# Patient Record
Sex: Female | Born: 1965
Health system: Southern US, Community
[De-identification: ages and names within clinical notes are randomized; demographics above are authoritative.]

## PROBLEM LIST (undated history)

## (undated) DIAGNOSIS — T7840XA Allergy, unspecified, initial encounter: Secondary | ICD-10-CM

## (undated) DIAGNOSIS — E039 Hypothyroidism, unspecified: Secondary | ICD-10-CM

## (undated) HISTORY — DX: Allergy, unspecified, initial encounter: T78.40XA

## (undated) HISTORY — DX: Hypothyroidism, unspecified: E03.9

---

## 1999-10-11 ENCOUNTER — Other Ambulatory Visit: Admission: RE | Admit: 1999-10-11 | Discharge: 1999-10-11 | Payer: Self-pay | Admitting: Gynecology

## 2004-02-03 ENCOUNTER — Ambulatory Visit (HOSPITAL_COMMUNITY): Admission: RE | Admit: 2004-02-03 | Discharge: 2004-02-03 | Payer: Self-pay | Admitting: Internal Medicine

## 2004-09-27 ENCOUNTER — Other Ambulatory Visit: Admission: RE | Admit: 2004-09-27 | Discharge: 2004-09-27 | Payer: Self-pay | Admitting: *Deleted

## 2006-01-02 ENCOUNTER — Other Ambulatory Visit: Admission: RE | Admit: 2006-01-02 | Discharge: 2006-01-02 | Payer: Self-pay | Admitting: *Deleted

## 2006-11-01 ENCOUNTER — Ambulatory Visit: Payer: Self-pay | Admitting: Family Medicine

## 2007-01-02 ENCOUNTER — Ambulatory Visit: Payer: Self-pay | Admitting: Family Medicine

## 2007-01-08 ENCOUNTER — Ambulatory Visit: Payer: Self-pay | Admitting: Family Medicine

## 2007-03-21 ENCOUNTER — Other Ambulatory Visit: Admission: RE | Admit: 2007-03-21 | Discharge: 2007-03-21 | Payer: Self-pay | Admitting: *Deleted

## 2007-12-24 ENCOUNTER — Ambulatory Visit: Payer: Self-pay | Admitting: Family Medicine

## 2009-11-16 LAB — HM MAMMOGRAPHY: HM Mammogram: NEGATIVE

## 2010-06-14 ENCOUNTER — Ambulatory Visit
Admission: RE | Admit: 2010-06-14 | Discharge: 2010-06-14 | Payer: Self-pay | Source: Home / Self Care | Attending: Family Medicine | Admitting: Family Medicine

## 2010-10-13 ENCOUNTER — Encounter: Payer: Self-pay | Admitting: Family Medicine

## 2010-10-13 ENCOUNTER — Ambulatory Visit (INDEPENDENT_AMBULATORY_CARE_PROVIDER_SITE_OTHER): Payer: BC Managed Care – PPO | Admitting: Family Medicine

## 2010-10-13 VITALS — BP 120/70 | HR 62 | Wt 140.0 lb

## 2010-10-13 DIAGNOSIS — R0602 Shortness of breath: Secondary | ICD-10-CM

## 2010-10-13 DIAGNOSIS — J309 Allergic rhinitis, unspecified: Secondary | ICD-10-CM | POA: Insufficient documentation

## 2010-10-13 DIAGNOSIS — J45909 Unspecified asthma, uncomplicated: Secondary | ICD-10-CM

## 2010-10-13 MED ORDER — ALBUTEROL 90 MCG/ACT IN AERS: 2.0000 | INHALATION_SPRAY | Freq: Four times a day (QID) | RESPIRATORY_TRACT | Status: DC | PRN

## 2010-10-13 MED ORDER — FLUTICASONE PROPIONATE 50 MCG/ACT NA SUSP: 2.0000 | Freq: Every day | NASAL | Status: DC

## 2010-10-13 NOTE — Patient Instructions (Signed)
Use the nasal spray daily to help with the congestion and drainage. You can stay on this one for ever. The Proventil should be used 2 puffs 4 times a day as needed however if you need it more than twice a week during the day or twice a month at night then were no immediate more aggressive Attention to any situations where you know your chest tightness and wheezing is worse.

## 2010-10-13 NOTE — Progress Notes (Signed)
  Subjective:    Patient ID: Carly Villa, female    DOB: 1966/02/06, 45 y.o.   MRN: [id number]  HPI she has had difficulty over the last year with postnasal drainage and nasal congestion. She has had no rhinorrhea, sneezing, itchy watery eyes, fever or chills. She has no history of seasonal allergies. In the last 2 weeks she has noted some chest tightness and occasional wheezing. She does smoke 45 cigarettes per day. Otherwise her review of systems is negative.    Review of Systems     Objective:   Physical Exam tympanic membranes normal. Throat is clear. Neck is supple without adenopathy. Cardiac exam shows regular rhythm without murmurs or gallops. Lungs are clear to auscultation. EKG shows no acute changes.       Assessment & Plan:  Allergic rhinitis. Asthma. I discussed all this in detail with her. I will place her on Flonase and Proventil. Discussed proper use these medications. Return here in one month for followup visit.

## 2010-11-17 ENCOUNTER — Encounter: Payer: Self-pay | Admitting: Family Medicine

## 2010-11-21 ENCOUNTER — Ambulatory Visit: Payer: BC Managed Care – PPO | Admitting: Family Medicine

## 2010-12-29 ENCOUNTER — Encounter: Payer: Self-pay | Admitting: Family Medicine

## 2011-06-20 ENCOUNTER — Other Ambulatory Visit: Payer: Self-pay | Admitting: Family Medicine

## 2011-08-21 ENCOUNTER — Other Ambulatory Visit: Payer: Self-pay | Admitting: Family Medicine

## 2011-10-03 ENCOUNTER — Telehealth: Payer: Self-pay | Admitting: Internal Medicine

## 2011-10-03 MED ORDER — SYNTHROID 125 MCG PO TABS: 125.0000 ug | ORAL_TABLET | Freq: Every day | ORAL | Status: DC

## 2011-10-03 NOTE — Telephone Encounter (Signed)
done

## 2011-10-10 ENCOUNTER — Encounter: Payer: BC Managed Care – PPO | Admitting: Family Medicine

## 2011-10-19 ENCOUNTER — Encounter: Payer: Self-pay | Admitting: Internal Medicine

## 2011-10-24 ENCOUNTER — Ambulatory Visit (INDEPENDENT_AMBULATORY_CARE_PROVIDER_SITE_OTHER): Payer: BC Managed Care – PPO | Admitting: Family Medicine

## 2011-10-24 ENCOUNTER — Encounter: Payer: Self-pay | Admitting: Family Medicine

## 2011-10-24 VITALS — BP 122/74 | HR 60 | Ht 64.25 in | Wt 138.0 lb

## 2011-10-24 DIAGNOSIS — E28319 Asymptomatic premature menopause: Secondary | ICD-10-CM

## 2011-10-24 DIAGNOSIS — J45909 Unspecified asthma, uncomplicated: Secondary | ICD-10-CM

## 2011-10-24 DIAGNOSIS — Z79890 Hormone replacement therapy (postmenopausal): Secondary | ICD-10-CM | POA: Insufficient documentation

## 2011-10-24 DIAGNOSIS — E039 Hypothyroidism, unspecified: Secondary | ICD-10-CM

## 2011-10-24 DIAGNOSIS — F17200 Nicotine dependence, unspecified, uncomplicated: Secondary | ICD-10-CM

## 2011-10-24 DIAGNOSIS — J309 Allergic rhinitis, unspecified: Secondary | ICD-10-CM | POA: Insufficient documentation

## 2011-10-24 DIAGNOSIS — Z0000 Encounter for general adult medical examination without abnormal findings: Secondary | ICD-10-CM

## 2011-10-24 LAB — POCT URINALYSIS DIPSTICK
Bilirubin, UA: NEGATIVE
Glucose, UA: NEGATIVE
Urobilinogen, UA: NEGATIVE

## 2011-10-24 LAB — TSH: TSH: 6.742 u[IU]/mL — ABNORMAL HIGH (ref 0.350–4.500)

## 2011-10-24 NOTE — Progress Notes (Signed)
  Subjective:    Patient ID: Carly Villa, female    DOB: Sep 05, 1965, 46 y.o.   MRN: [id number]  HPI She is here for a complete examination. She does have underlying thyroid disease and continues on this medication without trouble. She also is on hormone replacement for treatment of premature menopause. She was placed on this by her gynecologist. She continues on OTC medications and is having no difficulty with them. She doesn't smoke usually 3 cigarettes per day in the evening when she gets home and has a glass of wine with it. Her allergies give her very little difficulty. She does have asthma but has only use the inhaler twice this year. Social and family history were reviewed.   Review of Systems  Constitutional: Negative.   HENT: Negative.   Eyes: Negative.   Respiratory: Negative.   Cardiovascular: Negative.   Gastrointestinal: Negative.   Genitourinary: Negative.   Musculoskeletal: Negative.   Neurological: Negative.        Objective:   Physical Exam BP 122/74  Pulse 60  Ht 5' 4.25" (1.632 m)  Wt 138 lb (62.596 kg)  BMI 23.50 kg/m2  General Appearance:    Alert, cooperative, no distress, appears stated age  Head:    Normocephalic, without obvious abnormality, atraumatic  Eyes:    PERRL, conjunctiva/corneas clear, EOM's intact, fundi    benign  Ears:    Normal TM's and external ear canals  Nose:   Nares normal, mucosa normal, no drainage or sinus   tenderness  Throat:   Lips, mucosa, and tongue normal; teeth and gums normal  Neck:   Supple, no lymphadenopathy;  thyroid:  no   enlargement/tenderness/nodules; no carotid   bruit or JVD  Back:    Spine nontender, no curvature, ROM normal, no CVA     tenderness  Lungs:     Clear to auscultation bilaterally without wheezes, rales or     ronchi; respirations unlabored  Chest Wall:    No tenderness or deformity   Heart:    Regular rate and rhythm, S1 and S2 normal, no murmur, rub   or gallop  Breast Exam:    Deferred to GYN   Abdomen:     Soft, non-tender, nondistended, normoactive bowel sounds,    no masses, no hepatosplenomegaly  Genitalia:    Deferred to GYN     Extremities:   No clubbing, cyanosis or edema  Pulses:   2+ and symmetric all extremities  Skin:   Skin color, texture, turgor normal, no rashes or lesions  Lymph nodes:   Cervical, supraclavicular, and axillary nodes normal  Neurologic:   CNII-XII intact, normal strength, sensation and gait; reflexes 2+ and symmetric throughout          Psych:   Normal mood, affect, hygiene and grooming.          Assessment & Plan:   1. Physical exam, annual  POCT Urinalysis Dipstick, Lipid panel  2. Allergic rhinitis, mild    3. Asthma    4. Premature menopause on HRT    5. Hypothyroid  TSH  6. Current smoker on some days     discuss smoking cessation with her. Strongly encouraged her to change her smoking habits. Discussed alcohol consumption with this. Explained that her problem is mainly trigger related. Discussed various options is that of smoking and drinking alcohol. She will call if further difficulty. Otherwise continue on present medication regimen.

## 2011-10-25 LAB — LIPID PANEL
Total CHOL/HDL Ratio: 2.9 Ratio
VLDL: 16 mg/dL (ref 0–40)

## 2011-11-05 ENCOUNTER — Other Ambulatory Visit: Payer: Self-pay | Admitting: Family Medicine

## 2011-11-20 ENCOUNTER — Encounter: Payer: Self-pay | Admitting: Family Medicine

## 2012-03-05 ENCOUNTER — Ambulatory Visit: Payer: Self-pay | Admitting: Internal Medicine

## 2012-06-09 ENCOUNTER — Other Ambulatory Visit: Payer: Self-pay | Admitting: Family Medicine

## 2012-06-27 ENCOUNTER — Encounter: Payer: Self-pay | Admitting: Family Medicine

## 2012-06-27 ENCOUNTER — Ambulatory Visit (INDEPENDENT_AMBULATORY_CARE_PROVIDER_SITE_OTHER): Payer: BC Managed Care – PPO | Admitting: Family Medicine

## 2012-06-27 VITALS — BP 124/82 | HR 68 | Wt 141.0 lb

## 2012-06-27 DIAGNOSIS — M549 Dorsalgia, unspecified: Secondary | ICD-10-CM

## 2012-06-27 DIAGNOSIS — K219 Gastro-esophageal reflux disease without esophagitis: Secondary | ICD-10-CM

## 2012-06-27 NOTE — Progress Notes (Signed)
  Subjective:    Patient ID: Carly Villa, female    DOB: 19-Jun-1965, 47 y.o.   MRN: [id number]  HPI She has a five-month history of acid feeling occurring after meals. Can occur with any food. She has been taking Zantac which did initially work but is not working now. Sometimes she will wake up at night. No particular food makes this worse. Size a two-week history of upper back stiffness that occurs usually in the morning but would then slowly go away. She now notes that the pain is moved down and she notes it more in her low back. Again the pain does tend to get better as the day goes on. She has tried 2 Advil She does have concerns over esophageal cancer as well as lung cancer. She does smoke. Review of Systems     Objective:   Physical Exam Alert and in no distress. Cardiac exam shows regular rhythm without murmurs gallops. Lungs are clear to auscultation. Abdominal exam shows normal bowel sounds without masses or tenderness. Back exam shows full motion. No tenderness to palpation. Lungs are clear to auscultation.       Assessment & Plan:   1. GERD (gastroesophageal reflux disease)   2. Back pain    reassured her that none of her symptoms point towards cancer. Recommend she use Prilosec for her reflux symptoms and double dose if no improvement. She is then to call me for switch to a different PPI. Also recommended conservative care for back including anti-inflammatory heat and stretching. She will return here if continued difficulty.

## 2012-06-27 NOTE — Patient Instructions (Signed)
Use Prilosec 1 pill per day for the reflux symptoms and if that doesn't work go to 2 pills per day. If that doesn't work call me and I will call in a different medication. Once you have it under control after a week or 2 go to every other day and if it stays under control then you can even go to every 3 days.For the back use 4 Advil 3 times per day, heat for 20 minutes 3 times per day and then stretch. Proper lifting sitting and and sleeping

## 2012-07-23 ENCOUNTER — Other Ambulatory Visit: Payer: Self-pay | Admitting: Family Medicine

## 2012-09-13 ENCOUNTER — Ambulatory Visit (INDEPENDENT_AMBULATORY_CARE_PROVIDER_SITE_OTHER): Payer: BC Managed Care – PPO | Admitting: Family Medicine

## 2012-09-13 ENCOUNTER — Encounter: Payer: Self-pay | Admitting: Family Medicine

## 2012-09-13 VITALS — BP 134/82 | HR 68 | Wt 139.0 lb

## 2012-09-13 DIAGNOSIS — L723 Sebaceous cyst: Secondary | ICD-10-CM

## 2012-09-13 DIAGNOSIS — L089 Local infection of the skin and subcutaneous tissue, unspecified: Secondary | ICD-10-CM

## 2012-09-13 MED ORDER — SYNTHROID 125 MCG PO TABS: 125.0000 ug | ORAL_TABLET | Freq: Every day | ORAL | Status: DC

## 2012-09-13 MED ORDER — DOXYCYCLINE HYCLATE 100 MG PO TABS: 100.0000 mg | ORAL_TABLET | Freq: Two times a day (BID) | ORAL | Status: DC

## 2012-09-13 NOTE — Patient Instructions (Addendum)
Change the dressing as needed depending upon how much it drains. Take the packing out on Sunday and you can irrigate it. Call me if you have a questions.

## 2012-09-13 NOTE — Progress Notes (Signed)
  Subjective:    Patient ID: Carly Villa, female    DOB: May 25, 1965, 47 y.o.   MRN: [id number]  HPI She is here for evaluation of swollen tender red lesionunderneath the left breast.   Review of Systems     Objective:   Physical Exam A 3 cm slightly raised tender fluctuant lesion is noted with central dimpling       Assessment & Plan:  Infected sebaceous cyst - Plan: DISCONTINUED: doxycycline (VIBRA-TABS) 100 MG tablet doxycycline not prescribed.  the area was injected with Xylocaine and epinephrine. A 2 cm incision was made. Purulent material was expressed as well as parts of the cyst sac. The wound wascleaned and the cyst sac removed. Was packed with iodoform. She is to remove the packing in 2 days and irrigate the area.

## 2012-10-28 ENCOUNTER — Telehealth: Payer: Self-pay | Admitting: Family Medicine

## 2012-10-28 MED ORDER — SYNTHROID 125 MCG PO TABS: 125.0000 ug | ORAL_TABLET | Freq: Every day | ORAL | Status: DC

## 2012-10-28 NOTE — Telephone Encounter (Signed)
Sent in med with 1 refill

## 2012-10-28 NOTE — Telephone Encounter (Signed)
Pt scheduled a cpe in July but will be out of meds before then. Pt needs synthroid. Pt uses [facility].

## 2012-12-06 ENCOUNTER — Encounter: Payer: Self-pay | Admitting: Family Medicine

## 2012-12-06 ENCOUNTER — Ambulatory Visit (INDEPENDENT_AMBULATORY_CARE_PROVIDER_SITE_OTHER): Payer: BC Managed Care – PPO | Admitting: Family Medicine

## 2012-12-06 VITALS — BP 114/70 | HR 74 | Ht 65.0 in | Wt 137.0 lb

## 2012-12-06 DIAGNOSIS — M899 Disorder of bone, unspecified: Secondary | ICD-10-CM

## 2012-12-06 DIAGNOSIS — E039 Hypothyroidism, unspecified: Secondary | ICD-10-CM

## 2012-12-06 DIAGNOSIS — Z0000 Encounter for general adult medical examination without abnormal findings: Secondary | ICD-10-CM

## 2012-12-06 DIAGNOSIS — E28319 Asymptomatic premature menopause: Secondary | ICD-10-CM

## 2012-12-06 DIAGNOSIS — J309 Allergic rhinitis, unspecified: Secondary | ICD-10-CM

## 2012-12-06 DIAGNOSIS — M8580 Other specified disorders of bone density and structure, unspecified site: Secondary | ICD-10-CM

## 2012-12-06 LAB — POCT URINALYSIS DIPSTICK
Bilirubin, UA: NEGATIVE
Blood, UA: NEGATIVE
Ketones, UA: NEGATIVE
Leukocytes, UA: NEGATIVE

## 2012-12-06 LAB — CBC WITH DIFFERENTIAL/PLATELET
Basophils Absolute: 0 10*3/uL (ref 0.0–0.1)
Eosinophils Absolute: 0.2 10*3/uL (ref 0.0–0.7)
HCT: 38.9 % (ref 36.0–46.0)
Hemoglobin: 13.1 g/dL (ref 12.0–15.0)
Lymphocytes Relative: 34 % (ref 12–46)
MCHC: 33.7 g/dL (ref 30.0–36.0)
Monocytes Relative: 9 % (ref 3–12)
Neutrophils Relative %: 54 % (ref 43–77)
Platelets: 335 10*3/uL (ref 150–400)
RBC: 4.1 MIL/uL (ref 3.87–5.11)

## 2012-12-06 LAB — HM DEXA SCAN

## 2012-12-06 MED ORDER — SYNTHROID 125 MCG PO TABS: 125.0000 ug | ORAL_TABLET | Freq: Every day | ORAL | Status: DC

## 2012-12-06 NOTE — Progress Notes (Signed)
  Subjective:    Patient ID: Carly Villa, female    DOB: 08/31/1965, 47 y.o.   MRN: [id number]  HPI She is here for complete examination. She does see her gynecologist regularly. She apparently does have premature menopause. A DEXA scan was done which did show evidence of osteopenia and she now is on vitamin D and calcium as well as a regular multivitamin. Her allergies are under good control. She continues on Synthroid for treatment of hypothyroidism. She does smoke 2 cigarettes per day has part of her relaxation after work. Social and family history were reviewed.   Review of Systems  Constitutional: Negative.   HENT: Negative.   Eyes: Negative.   Respiratory: Negative.   Cardiovascular: Negative.   Gastrointestinal: Negative.   Endocrine: Negative.   Allergic/Immunologic: Negative.   Neurological: Negative.        Objective:   Physical Exam BP 114/70  Pulse 74  Ht 5\' 5"  (1.651 m)  Wt 137 lb (62.143 kg)  BMI 22.8 kg/m2  General Appearance:    Alert, cooperative, no distress, appears stated age  Head:    Normocephalic, without obvious abnormality, atraumatic  Eyes:    PERRL, conjunctiva/corneas clear, EOM's intact, fundi    benign  Ears:    Normal TM's and external ear canals  Nose:   Nares normal, mucosa normal, no drainage or sinus   tenderness  Throat:   Lips, mucosa, and tongue normal; teeth and gums normal  Neck:   Supple, no lymphadenopathy;  thyroid:  no   enlargement/tenderness/nodules; no carotid   bruit or JVD  Back:    Spine nontender, no curvature, ROM normal, no CVA     tenderness  Lungs:     Clear to auscultation bilaterally without wheezes, rales or     ronchi; respirations unlabored  Chest Wall:    No tenderness or deformity   Heart:    Regular rate and rhythm, S1 and S2 normal, no murmur, rub   or gallop  Breast Exam:    Deferred to GYN  Abdomen:     Soft, non-tender, nondistended, normoactive bowel sounds,    no masses, no hepatosplenomegaly   Genitalia:    Deferred to GYN     Extremities:   No clubbing, cyanosis or edema  Pulses:   2+ and symmetric all extremities  Skin:   Skin color, texture, turgor normal, no rashes or lesions  Lymph nodes:   Cervical, supraclavicular, and axillary nodes normal  Neurologic:   CNII-XII intact, normal strength, sensation and gait; reflexes 2+ and symmetric throughout          Psych:   Normal mood, affect, hygiene and grooming.          Assessment & Plan:  Routine general medical examination at a health care facility - Plan: POCT Urinalysis Dipstick, CBC with Differential, Comprehensive metabolic panel, Lipid panel  Allergic rhinitis, mild  Hypothyroid - Plan: TSH, SYNTHROID 125 MCG tablet  Premature menopause on HRT  Osteopenia - Plan: CBC with Differential, Comprehensive metabolic panel  I discussed the fact that she smokes 2 cigarettes per day and explained that although not dangerous, is not smart either. Strongly encouraged her to use some other method for relaxation

## 2012-12-07 LAB — LIPID PANEL
Cholesterol: 187 mg/dL (ref 0–200)
LDL Cholesterol: 99 mg/dL (ref 0–99)
Triglycerides: 90 mg/dL (ref <150)
VLDL: 18 mg/dL (ref 0–40)

## 2012-12-07 LAB — COMPREHENSIVE METABOLIC PANEL
AST: 16 U/L (ref 0–37)
Albumin: 4.5 g/dL (ref 3.5–5.2)
Alkaline Phosphatase: 54 U/L (ref 39–117)
Chloride: 104 mEq/L (ref 96–112)
Creat: 0.58 mg/dL (ref 0.50–1.10)
Glucose, Bld: 93 mg/dL (ref 70–99)

## 2012-12-09 NOTE — Progress Notes (Signed)
Quick Note:  Mailed pt letter of lab results ______ 

## 2013-07-20 LAB — HM PAP SMEAR: HM PAP: NORMAL

## 2013-07-26 LAB — HM MAMMOGRAPHY: HM MAMMO: NORMAL

## 2013-08-11 ENCOUNTER — Ambulatory Visit: Payer: Self-pay | Admitting: Physician Assistant

## 2013-08-11 ENCOUNTER — Ambulatory Visit: Payer: Self-pay

## 2013-08-11 LAB — CBC WITH DIFFERENTIAL/PLATELET
BASOS ABS: 0 10*3/uL (ref 0.0–0.1)
Basophil %: 0.6 %
EOS ABS: 0.1 10*3/uL (ref 0.0–0.7)
Eosinophil %: 1.7 %
HCT: 36.4 % (ref 35.0–47.0)
HGB: 12.2 g/dL (ref 12.0–16.0)
LYMPHS ABS: 2.8 10*3/uL (ref 1.0–3.6)
LYMPHS PCT: 33.6 %
MCH: 32.3 pg (ref 26.0–34.0)
MCHC: 33.5 g/dL (ref 32.0–36.0)
MCV: 96 fL (ref 80–100)
MONO ABS: 0.7 x10 3/mm (ref 0.2–0.9)
MONOS PCT: 8.1 %
Neutrophil #: 4.6 10*3/uL (ref 1.4–6.5)
Neutrophil %: 56 %
PLATELETS: 325 10*3/uL (ref 150–440)
RBC: 3.78 10*6/uL — ABNORMAL LOW (ref 3.80–5.20)
RDW: 12.4 % (ref 11.5–14.5)
WBC: 8.2 10*3/uL (ref 3.6–11.0)

## 2013-08-11 LAB — TSH: THYROID STIMULATING HORM: 1.4 u[IU]/mL

## 2013-09-06 ENCOUNTER — Other Ambulatory Visit: Payer: Self-pay | Admitting: Family Medicine

## 2013-10-06 ENCOUNTER — Other Ambulatory Visit: Payer: Self-pay | Admitting: Family Medicine

## 2013-11-17 ENCOUNTER — Other Ambulatory Visit: Payer: Self-pay | Admitting: Family Medicine

## 2013-12-17 ENCOUNTER — Ambulatory Visit (INDEPENDENT_AMBULATORY_CARE_PROVIDER_SITE_OTHER): Payer: BC Managed Care – PPO | Admitting: Family Medicine

## 2013-12-17 ENCOUNTER — Encounter: Payer: Self-pay | Admitting: Family Medicine

## 2013-12-17 VITALS — BP 128/80 | HR 68 | Ht 65.0 in | Wt 140.0 lb

## 2013-12-17 DIAGNOSIS — E039 Hypothyroidism, unspecified: Secondary | ICD-10-CM

## 2013-12-17 DIAGNOSIS — J309 Allergic rhinitis, unspecified: Secondary | ICD-10-CM

## 2013-12-17 DIAGNOSIS — Z0000 Encounter for general adult medical examination without abnormal findings: Secondary | ICD-10-CM

## 2013-12-17 DIAGNOSIS — E28319 Asymptomatic premature menopause: Secondary | ICD-10-CM

## 2013-12-17 DIAGNOSIS — Z79890 Hormone replacement therapy (postmenopausal): Secondary | ICD-10-CM

## 2013-12-17 LAB — TSH: TSH: 0.959 u[IU]/mL (ref 0.350–4.500)

## 2013-12-17 NOTE — Progress Notes (Signed)
   Subjective:    Patient ID: Carly Villa, female    DOB: Oct 16, 1965, 48 y.o.   MRN: [id number]  HPI He is here for a complete examination. She sees her gynecologist regularly and continues on hormone replacement due to premature menopause. She does have underlying hypothyroid and continues on her I replacement. She does have underlying allergies and these cause very little difficulty. She has no other concerns or complaints. Her family and social history were reviewed. Her marriage and work are going quite well.   Review of Systems  All other systems reviewed and are negative.      Objective:   Physical Exam alert and in no distress. Tympanic membranes and canals are normal. Throat is clear. Tonsils are normal. Neck is supple without adenopathy or thyromegaly. Cardiac exam shows a regular sinus rhythm without murmurs or gallops. Lungs are clear to auscultation. Abdominal exam shows no masses or tenderness with normal bowel sounds        Assessment & Plan:  Routine general medical examination at a health care facility  Premature menopause on HRT  Hypothyroidism, unspecified hypothyroidism type - Plan: TSH  Allergic rhinitis, mild  I encouraged her to continue to take good care of herself.

## 2013-12-18 MED ORDER — SYNTHROID 125 MCG PO TABS: ORAL_TABLET | ORAL | Status: DC

## 2013-12-18 NOTE — Addendum Note (Signed)
Addended by: Denita Lung on: 12/18/2013 08:11 AM   Modules accepted: Orders

## 2014-01-22 ENCOUNTER — Ambulatory Visit: Payer: Self-pay | Admitting: Physician Assistant

## 2014-02-24 ENCOUNTER — Ambulatory Visit (INDEPENDENT_AMBULATORY_CARE_PROVIDER_SITE_OTHER): Payer: BC Managed Care – PPO | Admitting: Family Medicine

## 2014-02-24 ENCOUNTER — Encounter: Payer: Self-pay | Admitting: Family Medicine

## 2014-02-24 VITALS — BP 120/78 | HR 82 | Temp 98.2°F | Wt 140.0 lb

## 2014-02-24 DIAGNOSIS — J069 Acute upper respiratory infection, unspecified: Secondary | ICD-10-CM

## 2014-02-24 MED ORDER — AZITHROMYCIN 500 MG PO TABS: 500.0000 mg | ORAL_TABLET | Freq: Every day | ORAL | Status: DC

## 2014-02-24 NOTE — Patient Instructions (Signed)
Use Afrin nasal spray for your nasal congestion and your flight

## 2014-02-24 NOTE — Progress Notes (Signed)
   Subjective:    Patient ID: Carly Villa, female    DOB: 04/28/1966, 48 y.o.   MRN: [id number]  HPI 5 days ago she developed a sore throat, nasal congestion, hoarse voice and  chest congestion some wheezing,fatigue with myalgias. No earache, nausea or vomiting. She leaves for [place] on Friday. She does smoke 2 or 3 cigarettes per day.  Review of Systems     Objective:   Physical Exam alert and in no distress. Tympanic membranes and canals are normal. Throat is clear. Tonsils are normal. Neck is supple without adenopathy or thyromegaly. Cardiac exam shows a regular sinus rhythm without murmurs or gallops. Lungs are clear to auscultation.        Assessment & Plan:  URI, acute - Plan: azithromycin (ZITHROMAX) 500 MG tablet  I explained that her symptoms could certainly be all viral but I will give her the benefit of the doubt. Also recommend using Afrin nasal spray for the flight.

## 2014-10-02 LAB — HM MAMMOGRAPHY: HM MAMMO: NORMAL

## 2014-10-02 LAB — HM PAP SMEAR: HM PAP: NORMAL

## 2014-10-02 LAB — HM DEXA SCAN

## 2014-12-19 ENCOUNTER — Other Ambulatory Visit: Payer: Self-pay | Admitting: Family Medicine

## 2014-12-21 NOTE — Telephone Encounter (Signed)
Left message pt needs appointment before I can fill med

## 2014-12-21 NOTE — Telephone Encounter (Signed)
Pt called and scheduled cpe please fill medication

## 2015-01-08 ENCOUNTER — Encounter: Payer: Self-pay | Admitting: Family Medicine

## 2015-02-02 ENCOUNTER — Encounter: Payer: Self-pay | Admitting: Family Medicine

## 2015-02-02 ENCOUNTER — Ambulatory Visit (INDEPENDENT_AMBULATORY_CARE_PROVIDER_SITE_OTHER): Payer: BLUE CROSS/BLUE SHIELD | Admitting: Family Medicine

## 2015-02-02 VITALS — BP 120/70 | HR 68 | Ht 65.0 in | Wt 144.0 lb

## 2015-02-02 DIAGNOSIS — Z0000 Encounter for general adult medical examination without abnormal findings: Secondary | ICD-10-CM

## 2015-02-02 DIAGNOSIS — Z720 Tobacco use: Secondary | ICD-10-CM

## 2015-02-02 DIAGNOSIS — E038 Other specified hypothyroidism: Secondary | ICD-10-CM

## 2015-02-02 DIAGNOSIS — J309 Allergic rhinitis, unspecified: Secondary | ICD-10-CM

## 2015-02-02 DIAGNOSIS — E28319 Asymptomatic premature menopause: Secondary | ICD-10-CM

## 2015-02-02 DIAGNOSIS — Z87891 Personal history of nicotine dependence: Secondary | ICD-10-CM | POA: Insufficient documentation

## 2015-02-02 DIAGNOSIS — F17200 Nicotine dependence, unspecified, uncomplicated: Secondary | ICD-10-CM

## 2015-02-02 DIAGNOSIS — Z79890 Hormone replacement therapy (postmenopausal): Secondary | ICD-10-CM

## 2015-02-02 LAB — LIPID PANEL
CHOL/HDL RATIO: 2.9 ratio (ref <=5.0)
Cholesterol: 181 mg/dL (ref 125–200)
HDL: 62 mg/dL (ref >=46)
LDL CALC: 96 mg/dL (ref <130)
TRIGLYCERIDES: 116 mg/dL (ref <150)
VLDL: 23 mg/dL (ref <30)

## 2015-02-02 LAB — COMPREHENSIVE METABOLIC PANEL
ALT: 23 U/L (ref 6–29)
AST: 19 U/L (ref 10–35)
Albumin: 4.5 g/dL (ref 3.6–5.1)
Alkaline Phosphatase: 58 U/L (ref 33–115)
BUN: 11 mg/dL (ref 7–25)
CALCIUM: 9.8 mg/dL (ref 8.6–10.2)
CHLORIDE: 100 mmol/L (ref 98–110)
CO2: 23 mmol/L (ref 20–31)
Creat: 0.55 mg/dL (ref 0.50–1.10)
GLUCOSE: 87 mg/dL (ref 65–99)
POTASSIUM: 4.3 mmol/L (ref 3.5–5.3)
Sodium: 138 mmol/L (ref 135–146)
Total Bilirubin: 0.5 mg/dL (ref 0.2–1.2)
Total Protein: 6.8 g/dL (ref 6.1–8.1)

## 2015-02-02 LAB — CBC WITH DIFFERENTIAL/PLATELET
Basophils Absolute: 0.1 10*3/uL (ref 0.0–0.1)
Basophils Relative: 1 % (ref 0–1)
EOS PCT: 2 % (ref 0–5)
Eosinophils Absolute: 0.2 10*3/uL (ref 0.0–0.7)
HEMATOCRIT: 37.6 % (ref 36.0–46.0)
Hemoglobin: 12.9 g/dL (ref 12.0–15.0)
LYMPHS PCT: 36 % (ref 12–46)
Lymphs Abs: 2.9 10*3/uL (ref 0.7–4.0)
MCH: 32.7 pg (ref 26.0–34.0)
MCHC: 34.3 g/dL (ref 30.0–36.0)
MCV: 95.4 fL (ref 78.0–100.0)
MONO ABS: 0.8 10*3/uL (ref 0.1–1.0)
MONOS PCT: 10 % (ref 3–12)
MPV: 9.4 fL (ref 8.6–12.4)
Neutro Abs: 4.1 10*3/uL (ref 1.7–7.7)
Neutrophils Relative %: 51 % (ref 43–77)
Platelets: 330 10*3/uL (ref 150–400)
RBC: 3.94 MIL/uL (ref 3.87–5.11)
RDW: 13.6 % (ref 11.5–15.5)
WBC: 8.1 10*3/uL (ref 4.0–10.5)

## 2015-02-02 NOTE — Progress Notes (Signed)
Subjective:    Patient ID: Carly Villa, female    DOB: 1966-04-08, 49 y.o.   MRN: [id number]  HPI She is here for complete examination. She does have premature menopause and is on HRT. She sees her gynecologist regularly for this.She also has hypothyroid and continues on her Synthroid. Her allergies are under good control. She has had a several month history of a tingling sensation in the mid scapular area with slight [place] type sensation to left chest. It is worse with motion. No shortness of breath, fever, chills, true chest pain. In September she did have a toe fracture and now states that she has a slight numb feeling over the dorsum of the foot. She has started smoking again and blames this on the death of her mother. She does smoke less than a half a pack per day. Her work and home life are going well. Family and social history was otherwise reviewed. Immunizations and health maintenance was also reviewed. She did have a DEXA scan done this year.   Review of Systems  All other systems reviewed and are negative.      Objective:   Physical Exam BP 120/70 mmHg  Pulse 68  Ht 5\' 5"  (1.651 m)  Wt 144 lb (65.318 kg)  BMI 23.96 kg/m2  SpO2 99%  General Appearance:    Alert, cooperative, no distress, appears stated age  Head:    Normocephalic, without obvious abnormality, atraumatic  Eyes:    PERRL, conjunctiva/corneas clear, EOM's intact, fundi    benign  Ears:    Normal TM's and external ear canals  Nose:   Nares normal, mucosa normal, no drainage or sinus   tenderness  Throat:   Lips, mucosa, and tongue normal; teeth and gums normal  Neck:   Supple, no lymphadenopathy;  thyroid:  no   enlargement/tenderness/nodules; no carotid   bruit or JVD  Back:    Spine nontender, no curvature, ROM normal, no CVA     tenderness  Lungs:     Clear to auscultation bilaterally without wheezes, rales or     ronchi; respirations unlabored  Chest Wall:    No tenderness or deformity   Heart:     Regular rate and rhythm, S1 and S2 normal, no murmur, rub   or gallop  Breast Exam:    Deferred to GYN  Abdomen:     Soft, non-tender, nondistended, normoactive bowel sounds,    no masses, no hepatosplenomegaly  Genitalia:    Deferred to GYN     Extremities:   No clubbing, cyanosis or edema  Pulses:   2+ and symmetric all extremities  Skin:   Skin color, texture, turgor normal, no rashes or lesions  Lymph nodes:   Cervical, supraclavicular, and axillary nodes normal  Neurologic:   CNII-XII intact, normal strength, sensation and gait; reflexes 2+ and symmetric throughout          Psych:   Normal mood, affect, hygiene and grooming.          Assessment & Plan:  Routine general medical examination at a health care facility - Plan: CBC with Differential/Platelet, Comprehensive metabolic panel, Lipid panel  Allergic rhinitis, mild  Premature menopause on HRT  Other specified hypothyroidism  Current smoker I discussed smoking cessation with her. I explained that the amount that she is smoking now is not truly an addiction but more in relation to stress reduction. Discussed ways to mitigate this by taking deep breaths and taking time away when  she has a same urge to smoke. She will make further attempts to use this technique. If she has problems she is to return. She will otherwise continue on her present medications. Also explained that the tingling sensation in her upper back and decreased sensationOver the dorsum of the foot were of no major concern. She was comfortable with that.

## 2015-04-04 ENCOUNTER — Other Ambulatory Visit: Payer: Self-pay | Admitting: Family Medicine

## 2015-04-21 ENCOUNTER — Encounter: Payer: Self-pay | Admitting: Family Medicine

## 2015-04-21 ENCOUNTER — Ambulatory Visit (INDEPENDENT_AMBULATORY_CARE_PROVIDER_SITE_OTHER): Payer: BLUE CROSS/BLUE SHIELD | Admitting: Family Medicine

## 2015-04-21 VITALS — BP 130/80 | HR 65 | Ht 65.0 in | Wt 145.0 lb

## 2015-04-21 DIAGNOSIS — L659 Nonscarring hair loss, unspecified: Secondary | ICD-10-CM | POA: Diagnosis not present

## 2015-04-21 DIAGNOSIS — E039 Hypothyroidism, unspecified: Secondary | ICD-10-CM | POA: Diagnosis not present

## 2015-04-21 LAB — TSH: TSH: 2.569 u[IU]/mL (ref 0.350–4.500)

## 2015-04-21 NOTE — Progress Notes (Signed)
   Subjective:    Patient ID: Carly Villa, female    DOB: [birth year], 49 y.o.   MRN: [id number]  HPI She has noted over the last couple weeks difficulty with hair loss. She's had no new medications, she continues on her thyroid medication and has had no trouble with that. She has not changed brands. She has not been sick recently. No skin changes   Review of Systems     Objective:   Physical Exam Alert and in no distress. Skin is normal. Exam of her scalp shows no areas of alopecia       Assessment & Plan:  Hypothyroidism, unspecified hypothyroidism type - Plan: TSH  Hair loss - Plan: TSH  I will check a TSH on her but explained that this is probably related to an imbalance of hair loss/hair gain and should return to normal.

## 2015-12-01 LAB — HM PAP SMEAR: HM Pap smear: NORMAL

## 2015-12-01 LAB — HM MAMMOGRAPHY

## 2016-03-02 ENCOUNTER — Ambulatory Visit (INDEPENDENT_AMBULATORY_CARE_PROVIDER_SITE_OTHER): Payer: 59 | Admitting: Family Medicine

## 2016-03-02 ENCOUNTER — Encounter: Payer: Self-pay | Admitting: Family Medicine

## 2016-03-02 VITALS — BP 120/70 | HR 64 | Ht 65.0 in | Wt 143.0 lb

## 2016-03-02 DIAGNOSIS — F17200 Nicotine dependence, unspecified, uncomplicated: Secondary | ICD-10-CM

## 2016-03-02 DIAGNOSIS — Z79890 Hormone replacement therapy (postmenopausal): Secondary | ICD-10-CM | POA: Diagnosis not present

## 2016-03-02 DIAGNOSIS — Z23 Encounter for immunization: Secondary | ICD-10-CM

## 2016-03-02 DIAGNOSIS — L659 Nonscarring hair loss, unspecified: Secondary | ICD-10-CM | POA: Diagnosis not present

## 2016-03-02 DIAGNOSIS — Z0000 Encounter for general adult medical examination without abnormal findings: Secondary | ICD-10-CM

## 2016-03-02 DIAGNOSIS — J309 Allergic rhinitis, unspecified: Secondary | ICD-10-CM

## 2016-03-02 DIAGNOSIS — D171 Benign lipomatous neoplasm of skin and subcutaneous tissue of trunk: Secondary | ICD-10-CM

## 2016-03-02 DIAGNOSIS — E039 Hypothyroidism, unspecified: Secondary | ICD-10-CM

## 2016-03-02 DIAGNOSIS — E28319 Asymptomatic premature menopause: Secondary | ICD-10-CM

## 2016-03-02 LAB — LIPID PANEL
CHOL/HDL RATIO: 2.6 ratio (ref <=5.0)
Cholesterol: 194 mg/dL (ref 125–200)
HDL: 74 mg/dL (ref >=46)
LDL CALC: 82 mg/dL (ref <130)
TRIGLYCERIDES: 192 mg/dL — AB (ref <150)
VLDL: 38 mg/dL — AB (ref <30)

## 2016-03-02 LAB — CBC WITH DIFFERENTIAL/PLATELET
BASOS ABS: 0 {cells}/uL (ref 0–200)
Basophils Relative: 0 %
EOS PCT: 2 %
Eosinophils Absolute: 178 cells/uL (ref 15–500)
HCT: 37.8 % (ref 35.0–45.0)
Hemoglobin: 12.4 g/dL (ref 11.7–15.5)
Lymphocytes Relative: 26 %
Lymphs Abs: 2314 cells/uL (ref 850–3900)
MCH: 28.1 pg (ref 27.0–33.0)
MCHC: 32.8 g/dL (ref 32.0–36.0)
MCV: 85.7 fL (ref 80.0–100.0)
MONOS PCT: 10 %
MPV: 9.2 fL (ref 7.5–12.5)
Monocytes Absolute: 890 cells/uL (ref 200–950)
NEUTROS PCT: 62 %
Neutro Abs: 5518 cells/uL (ref 1500–7800)
PLATELETS: 360 10*3/uL (ref 140–400)
RBC: 4.41 MIL/uL (ref 3.80–5.10)
RDW: 17.5 % — ABNORMAL HIGH (ref 11.0–15.0)
WBC: 8.9 10*3/uL (ref 4.0–10.5)

## 2016-03-02 LAB — COMPREHENSIVE METABOLIC PANEL
ALBUMIN: 4.5 g/dL (ref 3.6–5.1)
ALT: 25 U/L (ref 6–29)
AST: 20 U/L (ref 10–35)
Alkaline Phosphatase: 59 U/L (ref 33–115)
BUN: 11 mg/dL (ref 7–25)
CHLORIDE: 102 mmol/L (ref 98–110)
CO2: 22 mmol/L (ref 20–31)
CREATININE: 0.54 mg/dL (ref 0.50–1.10)
Calcium: 9.8 mg/dL (ref 8.6–10.2)
Glucose, Bld: 89 mg/dL (ref 65–99)
POTASSIUM: 4.5 mmol/L (ref 3.5–5.3)
SODIUM: 136 mmol/L (ref 135–146)
Total Bilirubin: 0.5 mg/dL (ref 0.2–1.2)
Total Protein: 7.1 g/dL (ref 6.1–8.1)

## 2016-03-02 LAB — POCT URINALYSIS DIPSTICK
BILIRUBIN UA: NEGATIVE
Blood, UA: NEGATIVE
Glucose, UA: NEGATIVE
KETONES UA: NEGATIVE
LEUKOCYTES UA: NEGATIVE
Nitrite, UA: NEGATIVE
PH UA: 6
Protein, UA: NEGATIVE
SPEC GRAV UA: 1.025
Urobilinogen, UA: NEGATIVE

## 2016-03-02 LAB — TSH: TSH: 1.22 mIU/L

## 2016-03-02 MED ORDER — SYNTHROID 125 MCG PO TABS: 125.0000 ug | ORAL_TABLET | Freq: Every day | ORAL | 3 refills | Status: DC

## 2016-03-02 NOTE — Progress Notes (Signed)
Subjective:    Patient ID: Carly Villa, female    DOB: 1965/06/12, 50 y.o.   MRN: [id number]  HPI Is here for complete examination. She did have difficulty with hair loss in the past and was seen by dermatology. No particular etiology was noted. She also has a lesion on her left mid back area that she would like evaluated. She does see her gynecologist and is now on HRT. She is doing well on this. She smokes 3 or 4 cigarettes per day. Her allergies give her very little difficulty. She continues on her Synthroid and is having no problems with that. Work and home life is going well. She had a flu shot yesterday. Family and social history as well as health maintenance and immunizations were reviewed. Work and home life are going quite well. She does exercise fairly regularly. She has no other concerns or complaints.   Review of Systems  All other systems reviewed and are negative.      Objective:   Physical Exam BP 120/70   Pulse 64   Ht 5\' 5"  (1.651 m)   Wt 143 lb (64.9 kg)   BMI 23.80 kg/m   General Appearance:    Alert, cooperative, no distress, appears stated age  Head:    Normocephalic, without obvious abnormality, atraumatic  Eyes:    PERRL, conjunctiva/corneas clear, EOM's intact, fundi    benign  Ears:    Normal TM's and external ear canals  Nose:   Nares normal, mucosa normal, no drainage or sinus   tenderness  Throat:   Lips, mucosa, and tongue normal; teeth and gums normal  Neck:   Supple, no lymphadenopathy;  thyroid:  no   enlargement/tenderness/nodules; no carotid   bruit or JVD     Lungs:     Clear to auscultation bilaterally without wheezes, rales or     ronchi; respirations unlabored      Heart:    Regular rate and rhythm, S1 and S2 normal, no murmur, rub   or gallop  Breast Exam:    Deferred to GYN  Abdomen:     Soft, non-tender, nondistended, normoactive bowel sounds,    no masses, no hepatosplenomegaly  Genitalia:    Deferred to GYN     Extremities:    No clubbing, cyanosis or edema  Pulses:   2+ and symmetric all extremities  Skin:   Skin color, texture, turgor normal, A round smooth, movable lesion of approximately 2-1/2 cm is noted in the left mid back area.   Lymph nodes:   Cervical, supraclavicular, and axillary nodes normal  Neurologic:   CNII-XII intact, normal strength, sensation and gait; reflexes 2+ and symmetric throughout          Psych:   Normal mood, affect, hygiene and grooming.          Assessment & Plan:  Routine general medical examination at a health care facility - Plan: POCT Urinalysis Dipstick, CBC with Differential/Platelet, Comprehensive metabolic panel, Lipid panel  Need for prophylactic vaccination with combined diphtheria-tetanus-pertussis (DTP) vaccine - Plan: Tdap vaccine greater than or equal to 7yo IM  Hair loss - Plan: CBC with Differential/Platelet, Comprehensive metabolic panel  Allergic rhinitis, mild  Current smoker  Hypothyroidism, unspecified type - Plan: TSH, SYNTHROID 125 MCG tablet  Premature menopause on HRT  Lipoma of torso I explained that the lipoma is nothing to worry about unless it becomes bigger and becomes mechanically a problem. Discussed the cigarettes with her and again at  this point she is not ready to give up the amount that she is smoking. She will continue on her other medications.I did discuss the long-term use of HRC and encouraged her to use it for the minimum amount of time. I will call after the blood results are back. Continue with her exercise regimen.

## 2016-04-27 ENCOUNTER — Other Ambulatory Visit: Payer: Self-pay | Admitting: Obstetrics and Gynecology

## 2016-04-27 DIAGNOSIS — R928 Other abnormal and inconclusive findings on diagnostic imaging of breast: Secondary | ICD-10-CM

## 2016-05-03 ENCOUNTER — Ambulatory Visit
Admission: RE | Admit: 2016-05-03 | Discharge: 2016-05-03 | Disposition: A | Discharge status: Home / Self Care | Payer: Self-pay | Source: Ambulatory Visit | Attending: Obstetrics and Gynecology | Admitting: Obstetrics and Gynecology

## 2016-05-03 DIAGNOSIS — R928 Other abnormal and inconclusive findings on diagnostic imaging of breast: Secondary | ICD-10-CM

## 2017-02-02 LAB — HM PAP SMEAR: HM Pap smear: NORMAL

## 2017-03-13 ENCOUNTER — Ambulatory Visit (INDEPENDENT_AMBULATORY_CARE_PROVIDER_SITE_OTHER): Payer: No Typology Code available for payment source | Admitting: Family Medicine

## 2017-03-13 ENCOUNTER — Encounter: Payer: Self-pay | Admitting: Family Medicine

## 2017-03-13 VITALS — BP 110/68 | HR 65 | Ht 64.5 in | Wt 144.0 lb

## 2017-03-13 DIAGNOSIS — Z23 Encounter for immunization: Secondary | ICD-10-CM | POA: Diagnosis not present

## 2017-03-13 DIAGNOSIS — Z87891 Personal history of nicotine dependence: Secondary | ICD-10-CM | POA: Diagnosis not present

## 2017-03-13 DIAGNOSIS — Z0000 Encounter for general adult medical examination without abnormal findings: Secondary | ICD-10-CM | POA: Diagnosis not present

## 2017-03-13 DIAGNOSIS — J309 Allergic rhinitis, unspecified: Secondary | ICD-10-CM

## 2017-03-13 DIAGNOSIS — E039 Hypothyroidism, unspecified: Secondary | ICD-10-CM

## 2017-03-13 LAB — POCT URINALYSIS DIP (PROADVANTAGE DEVICE)
Bilirubin, UA: NEGATIVE
GLUCOSE UA: NEGATIVE mg/dL
Ketones, POC UA: NEGATIVE mg/dL
Leukocytes, UA: NEGATIVE
NITRITE UA: NEGATIVE
PROTEIN UA: NEGATIVE mg/dL
RBC UA: NEGATIVE
Specific Gravity, Urine: 1.01
Urobilinogen, Ur: NEGATIVE
pH, UA: 6 (ref 5.0–8.0)

## 2017-03-13 NOTE — Progress Notes (Signed)
   Subjective:    Patient ID: Carly Villa, female    DOB: August 07, 1965, 51 y.o.   MRN: [id number]  HPI She is here for complete examination. She is now postmenopausal and having no difficulty with her. She has quit smoking. Her allergies are under good control. She had a mammogram in 2017 and a recent Pap. She continues on Synthroid and is having no difficulty with that. No fever, chills, skin changes. She is also taking multivitamins. She has no other concerns or complaints. Family and social history is 8 minutes and immunizations was reviewed. She does exercise regularly.  Review of Systems  All other systems reviewed and are negative.      Objective:   Physical Exam BP 110/68   Pulse 65   Ht 5' 4.5" (1.638 m)   Wt 144 lb (65.3 kg)   SpO2 99%   BMI 24.34 kg/m   General Appearance:    Alert, cooperative, no distress, appears stated age  Head:    Normocephalic, without obvious abnormality, atraumatic  Eyes:    PERRL, conjunctiva/corneas clear, EOM's intact, fundi    benign  Ears:    Normal TM's and external ear canals  Nose:   Nares normal, mucosa normal, no drainage or sinus   tenderness  Throat:   Lips, mucosa, and tongue normal; teeth and gums normal  Neck:   Supple, no lymphadenopathy;  thyroid:  no   enlargement/tenderness/nodules; no carotid   bruit or JVD     Lungs:     Clear to auscultation bilaterally without wheezes, rales or     ronchi; respirations unlabored      Heart:    Regular rate and rhythm, S1 and S2 normal, no murmur, rub   or gallop  Breast Exam:    Deferred to GYN  Abdomen:     Soft, non-tender, nondistended, normoactive bowel sounds,    no masses, no hepatosplenomegaly  Genitalia:    Deferred to GYN     Extremities:   No clubbing, cyanosis or edema  Pulses:   2+ and symmetric all extremities  Skin:   Skin color, texture, turgor normal, no rashes or lesions  Lymph nodes:   Cervical, supraclavicular, and axillary nodes normal  Neurologic:    CNII-XII intact, normal strength, sensation and gait; reflexes 2+ and symmetric throughout          Psych:   Normal mood, affect, hygiene and grooming.          Assessment & Plan:  Routine general medical examination at a health care facility - Plan: POCT Urinalysis DIP (Proadvantage Device)  Need for influenza vaccination - Plan: Flu Vaccine QUAD 36+ mos IM  Hypothyroidism, unspecified type  Former smoker  Allergic rhinitis, mild She seems be doing a good job taking care of herself and I encouraged her to continue. Prescription written for her to get blood work done at a different facility.

## 2017-03-14 ENCOUNTER — Other Ambulatory Visit: Payer: Self-pay | Admitting: Family Medicine

## 2017-03-14 DIAGNOSIS — E039 Hypothyroidism, unspecified: Secondary | ICD-10-CM

## 2017-03-15 DIAGNOSIS — Z23 Encounter for immunization: Secondary | ICD-10-CM | POA: Diagnosis not present

## 2017-03-21 ENCOUNTER — Encounter: Payer: Self-pay | Admitting: Family Medicine

## 2017-10-08 ENCOUNTER — Other Ambulatory Visit: Payer: Self-pay | Admitting: Family Medicine

## 2017-10-08 DIAGNOSIS — E039 Hypothyroidism, unspecified: Secondary | ICD-10-CM

## 2018-03-18 ENCOUNTER — Ambulatory Visit: Payer: BLUE CROSS/BLUE SHIELD | Admitting: Family Medicine

## 2018-03-18 ENCOUNTER — Encounter: Payer: Self-pay | Admitting: Family Medicine

## 2018-03-18 VITALS — BP 140/86 | HR 58 | Temp 98.0°F | Ht 64.75 in | Wt 144.2 lb

## 2018-03-18 DIAGNOSIS — M8580 Other specified disorders of bone density and structure, unspecified site: Secondary | ICD-10-CM | POA: Diagnosis not present

## 2018-03-18 DIAGNOSIS — Z780 Asymptomatic menopausal state: Secondary | ICD-10-CM | POA: Diagnosis not present

## 2018-03-18 DIAGNOSIS — E039 Hypothyroidism, unspecified: Secondary | ICD-10-CM

## 2018-03-18 DIAGNOSIS — Z23 Encounter for immunization: Secondary | ICD-10-CM

## 2018-03-18 DIAGNOSIS — Z1211 Encounter for screening for malignant neoplasm of colon: Secondary | ICD-10-CM | POA: Diagnosis not present

## 2018-03-18 DIAGNOSIS — Z0000 Encounter for general adult medical examination without abnormal findings: Secondary | ICD-10-CM

## 2018-03-18 DIAGNOSIS — Z1239 Encounter for other screening for malignant neoplasm of breast: Secondary | ICD-10-CM | POA: Diagnosis not present

## 2018-03-18 LAB — POCT URINALYSIS DIP (PROADVANTAGE DEVICE)
Bilirubin, UA: NEGATIVE
Blood, UA: NEGATIVE
GLUCOSE UA: NEGATIVE mg/dL
Ketones, POC UA: NEGATIVE mg/dL
Leukocytes, UA: NEGATIVE
NITRITE UA: NEGATIVE
Protein Ur, POC: NEGATIVE mg/dL
Specific Gravity, Urine: 1.015
UUROB: 3.5
pH, UA: 6 (ref 5.0–8.0)

## 2018-03-18 MED ORDER — SYNTHROID 125 MCG PO TABS: 125.0000 ug | ORAL_TABLET | Freq: Every day | ORAL | 3 refills | Status: DC

## 2018-03-18 NOTE — Progress Notes (Signed)
Subjective:    Patient ID: Carly Villa, female    DOB: 1965-07-14, 52 y.o.   MRN: [id number]  HPI She is here for complete examination.  She has been on hormone replacement due to early menopause.  She did try to stop the last year and her menopausal symptoms did recur.  Her last Pap was in 2018.  She does need follow-up on mammogram.  She also will need follow-up on colon cancer screening.  She does have a history of osteopenia and the last DEXA was 2 years ago.  She has an underlying history of hypothyroidism.  Otherwise she has no concerns.  She is in the process of looking for a new job which does have her stress.  Her home life is going well.  Family and social history as well as health maintenance and immunizations was reviewed   Review of Systems  All other systems reviewed and are negative.      Objective:   Physical Exam BP 140/86 (BP Location: Left Arm, Patient Position: Sitting)   Pulse (!) 58   Temp 98 F (36.7 C)   Ht 5' 4.75" (1.645 m)   Wt 144 lb 3.2 oz (65.4 kg)   SpO2 98%   BMI 24.18 kg/m   General Appearance:    Alert, cooperative, no distress, appears stated age  Head:    Normocephalic, without obvious abnormality, atraumatic  Eyes:    PERRL, conjunctiva/corneas clear, EOM's intact, fundi    benign  Ears:    Normal TM's and external ear canals  Nose:   Nares normal, mucosa normal, no drainage or sinus   tenderness  Throat:   Lips, mucosa, and tongue normal; teeth and gums normal  Neck:   Supple, no lymphadenopathy;  thyroid:  no   enlargement/tenderness/nodules; no carotid   bruit or JVD  Back:    Spine nontender, no curvature, ROM normal, no CVA     tenderness  Lungs:     Clear to auscultation bilaterally without wheezes, rales or     ronchi; respirations unlabored      Heart:    Regular rate and rhythm, S1 and S2 normal, no murmur, rub   or gallop  Breast Exam:    Deferred to GYN  Abdomen:     Soft, non-tender, nondistended, normoactive bowel  sounds,    no masses, no hepatosplenomegaly  Genitalia:    Deferred to GYN     Extremities:   No clubbing, cyanosis or edema  Pulses:   2+ and symmetric all extremities  Skin:   Skin color, texture, turgor normal, no rashes or lesions  Lymph nodes:   Cervical, supraclavicular, and axillary nodes normal  Neurologic:   CNII-XII intact, normal strength, sensation and gait; reflexes 2+ and symmetric throughout          Psych:   Normal mood, affect, hygiene and grooming.          Assessment & Plan:  Routine general medical examination at a health care facility - Plan: POCT Urinalysis DIP (Proadvantage Device), CBC with Differential/Platelet, Comprehensive metabolic panel, Lipid panel  Screening for colon cancer - Plan: Cologuard  Osteopenia, unspecified location - Plan: DG Bone Density  Hypothyroidism, unspecified type - Plan: TSH, SYNTHROID 125 MCG tablet  Menopause  Need for influenza vaccination - Plan: Flu Vaccine QUAD 6+ mos PF IM (Fluarix Quad PF)  Screening for breast cancer - Plan: MM DIGITAL SCREENING BILATERAL I discussed treatment of menopause with her.  She will  stop her hormone replacement and see how she does.  Recommend trying black cohosh and then we might need to switch her to an SSRI if needed.  She was comfortable with that.  Encouraged her to remain physically active.

## 2018-03-18 NOTE — Patient Instructions (Signed)
If you start getting menopausal symptoms try black cohosh first and if that does not work then let me know

## 2018-03-19 ENCOUNTER — Encounter: Payer: Self-pay | Admitting: Family Medicine

## 2018-03-19 LAB — CBC WITH DIFFERENTIAL/PLATELET
Basophils Absolute: 0.1 10*3/uL (ref 0.0–0.2)
Basos: 1 %
EOS (ABSOLUTE): 0.2 10*3/uL (ref 0.0–0.4)
Eos: 4 %
HEMATOCRIT: 36.1 % (ref 34.0–46.6)
HEMOGLOBIN: 12.4 g/dL (ref 11.1–15.9)
IMMATURE GRANULOCYTES: 0 %
Immature Grans (Abs): 0 10*3/uL (ref 0.0–0.1)
Lymphocytes Absolute: 2.1 10*3/uL (ref 0.7–3.1)
Lymphs: 34 %
MCH: 31.7 pg (ref 26.6–33.0)
MCHC: 34.3 g/dL (ref 31.5–35.7)
MCV: 92 fL (ref 79–97)
MONOCYTES: 9 %
Monocytes Absolute: 0.6 10*3/uL (ref 0.1–0.9)
NEUTROS PCT: 52 %
Neutrophils Absolute: 3.2 10*3/uL (ref 1.4–7.0)
Platelets: 333 10*3/uL (ref 150–450)
RBC: 3.91 x10E6/uL (ref 3.77–5.28)
RDW: 12.8 % (ref 12.3–15.4)
WBC: 6.2 10*3/uL (ref 3.4–10.8)

## 2018-03-19 LAB — COMPREHENSIVE METABOLIC PANEL
A/G RATIO: 1.8 (ref 1.2–2.2)
ALBUMIN: 4.3 g/dL (ref 3.5–5.5)
ALT: 23 IU/L (ref 0–32)
AST: 19 IU/L (ref 0–40)
Alkaline Phosphatase: 61 IU/L (ref 39–117)
BUN / CREAT RATIO: 17 (ref 9–23)
BUN: 8 mg/dL (ref 6–24)
CO2: 21 mmol/L (ref 20–29)
Calcium: 9.3 mg/dL (ref 8.7–10.2)
Chloride: 103 mmol/L (ref 96–106)
Creatinine, Ser: 0.47 mg/dL — ABNORMAL LOW (ref 0.57–1.00)
GFR calc Af Amer: 133 mL/min/{1.73_m2} (ref >59)
GFR calc non Af Amer: 116 mL/min/{1.73_m2} (ref >59)
GLUCOSE: 92 mg/dL (ref 65–99)
Globulin, Total: 2.4 g/dL (ref 1.5–4.5)
Potassium: 4.2 mmol/L (ref 3.5–5.2)
SODIUM: 140 mmol/L (ref 134–144)
Total Protein: 6.7 g/dL (ref 6.0–8.5)

## 2018-03-19 LAB — LIPID PANEL
CHOLESTEROL TOTAL: 227 mg/dL — AB (ref 100–199)
Chol/HDL Ratio: 3.2 ratio (ref 0.0–4.4)
HDL: 70 mg/dL (ref >39)
LDL Calculated: 109 mg/dL — ABNORMAL HIGH (ref 0–99)
Triglycerides: 239 mg/dL — ABNORMAL HIGH (ref 0–149)
VLDL CHOLESTEROL CAL: 48 mg/dL — AB (ref 5–40)

## 2018-03-19 LAB — TSH: TSH: 3.38 u[IU]/mL (ref 0.450–4.500)

## 2018-04-24 ENCOUNTER — Ambulatory Visit: Payer: 59

## 2018-05-20 ENCOUNTER — Telehealth: Payer: Self-pay

## 2018-05-20 NOTE — Telephone Encounter (Signed)
Note came that cologuard sample was needed. Pt was called to advise to send in sample soon. LVM Vesta

## 2018-05-23 ENCOUNTER — Encounter: Payer: Self-pay | Admitting: Family Medicine

## 2018-06-04 ENCOUNTER — Ambulatory Visit
Admission: RE | Admit: 2018-06-04 | Discharge: 2018-06-04 | Disposition: A | Discharge status: Home / Self Care | Payer: BLUE CROSS/BLUE SHIELD | Source: Ambulatory Visit | Attending: Family Medicine | Admitting: Family Medicine

## 2018-06-04 ENCOUNTER — Encounter: Payer: Self-pay | Admitting: Radiology

## 2018-06-04 DIAGNOSIS — Z1231 Encounter for screening mammogram for malignant neoplasm of breast: Secondary | ICD-10-CM | POA: Diagnosis not present

## 2018-06-18 DIAGNOSIS — Z1211 Encounter for screening for malignant neoplasm of colon: Secondary | ICD-10-CM | POA: Diagnosis not present

## 2018-06-18 LAB — COLOGUARD: Cologuard: NEGATIVE

## 2018-06-25 ENCOUNTER — Telehealth: Payer: Self-pay

## 2018-06-25 NOTE — Telephone Encounter (Signed)
Called pt to advise of negative cologuard results . Carly  Villa

## 2019-03-20 ENCOUNTER — Other Ambulatory Visit: Payer: Self-pay

## 2019-03-20 ENCOUNTER — Encounter: Payer: Self-pay | Admitting: Family Medicine

## 2019-03-20 ENCOUNTER — Ambulatory Visit: Payer: BC Managed Care – PPO | Admitting: Family Medicine

## 2019-03-20 VITALS — BP 136/84 | HR 69 | Temp 98.4°F | Ht 65.0 in | Wt 142.8 lb

## 2019-03-20 DIAGNOSIS — R0781 Pleurodynia: Secondary | ICD-10-CM

## 2019-03-20 DIAGNOSIS — R030 Elevated blood-pressure reading, without diagnosis of hypertension: Secondary | ICD-10-CM | POA: Diagnosis not present

## 2019-03-20 DIAGNOSIS — Z87891 Personal history of nicotine dependence: Secondary | ICD-10-CM | POA: Diagnosis not present

## 2019-03-20 DIAGNOSIS — Z0000 Encounter for general adult medical examination without abnormal findings: Secondary | ICD-10-CM | POA: Diagnosis not present

## 2019-03-20 DIAGNOSIS — J309 Allergic rhinitis, unspecified: Secondary | ICD-10-CM | POA: Diagnosis not present

## 2019-03-20 DIAGNOSIS — M8580 Other specified disorders of bone density and structure, unspecified site: Secondary | ICD-10-CM | POA: Diagnosis not present

## 2019-03-20 DIAGNOSIS — E039 Hypothyroidism, unspecified: Secondary | ICD-10-CM

## 2019-03-20 DIAGNOSIS — D171 Benign lipomatous neoplasm of skin and subcutaneous tissue of trunk: Secondary | ICD-10-CM

## 2019-03-20 DIAGNOSIS — R232 Flushing: Secondary | ICD-10-CM

## 2019-03-20 DIAGNOSIS — Z23 Encounter for immunization: Secondary | ICD-10-CM

## 2019-03-20 MED ORDER — SYNTHROID 125 MCG PO TABS: 125.0000 ug | ORAL_TABLET | Freq: Every day | ORAL | 3 refills | Status: DC

## 2019-03-20 NOTE — Progress Notes (Signed)
   Subjective:    Patient ID: Carly Villa, female    DOB: 08-01-1965, 53 y.o.   MRN: [id number]  HPI She is here for complete examination.  She has been off her hormone replacement for at least 3 months and has had some difficulty with hot flashes but no menses.  She does plan to see her gynecologist in the near future.  She also has lesions on her back that she would like looked at.  She has a 4-year history of difficulty with left posterior rib tingling/tightness that is intermittent in nature.  No shortness of breath, coughing.  Her allergies are under good control.  She continues on her thyroid medicine without trouble.  She does have a history of osteopenia and is taking calcium and vitamin D.  Her last DEXA scan was in 2016.  She is working.  Her home life is stable.  Family and social history as well as health maintenance and immunizations was reviewed.   Review of Systems  All other systems reviewed and are negative.      Objective:   Physical Exam Alert and in no distress. Tympanic membranes and canals are normal. Pharyngeal area is normal. Neck is supple without adenopathy or thyromegaly. Cardiac exam shows a regular sinus rhythm without murmurs or gallops. Lungs are clear to auscultation.  Abdominal exam shows no masses or tenderness with normal bowel sounds.  Exam of her back does show to round smooth movable lesions that are approximately 4 cm in size.       Assessment & Plan:  Routine general medical examination at a health care facility - Plan: CBC with Differential/Platelet, Comprehensive metabolic panel, Lipid panel  Allergic rhinitis, mild - : Continue to treat symptomatically.  Hypothyroidism, unspecified type - Plan: SYNTHROID 125 MCG tablet  TSH  Former smoker: Review of record indicates she has a very low use of cigarettes discussed the fact that she is probably not at risk at this point.  Osteopenia, unspecified location - Plan: DG Bone Density  Need for  influenza vaccination - Plan: Flu Vaccine QUAD 6+ mos PF IM (Fluarix Quad PF)  Rib pain on right side - Plan: DG Chest 2 View  Elevated blood pressure reading - Plan: CBC with Differential/Platelet, Comprehensive metabolic panel : She is also to return here in 2 months for recheck on her blood pressure.  Lipoma of torso: I explained that these are benign in nature and nothing needs to be done. Hot flashes: Did recommend using black cohosh.  Also mentioned the possibility of using an SSRI on at.  She will keep me informed.

## 2019-03-20 NOTE — Patient Instructions (Addendum)
Try black cohosh 20 minutes of something physical daily or 150 minutes a week of something physical

## 2019-03-21 LAB — COMPREHENSIVE METABOLIC PANEL
ALT: 29 IU/L (ref 0–32)
AST: 22 IU/L (ref 0–40)
Albumin/Globulin Ratio: 2 (ref 1.2–2.2)
Albumin: 4.5 g/dL (ref 3.8–4.9)
Alkaline Phosphatase: 80 IU/L (ref 39–117)
BUN/Creatinine Ratio: 18 (ref 9–23)
BUN: 10 mg/dL (ref 6–24)
Bilirubin Total: 0.2 mg/dL (ref 0.0–1.2)
CO2: 22 mmol/L (ref 20–29)
Calcium: 9.9 mg/dL (ref 8.7–10.2)
Chloride: 101 mmol/L (ref 96–106)
Creatinine, Ser: 0.55 mg/dL — ABNORMAL LOW (ref 0.57–1.00)
GFR calc Af Amer: 126 mL/min/{1.73_m2} (ref >59)
GFR calc non Af Amer: 109 mL/min/{1.73_m2} (ref >59)
Globulin, Total: 2.3 g/dL (ref 1.5–4.5)
Glucose: 97 mg/dL (ref 65–99)
Potassium: 4.7 mmol/L (ref 3.5–5.2)
Sodium: 139 mmol/L (ref 134–144)
Total Protein: 6.8 g/dL (ref 6.0–8.5)

## 2019-03-21 LAB — CBC WITH DIFFERENTIAL/PLATELET
Basophils Absolute: 0.1 10*3/uL (ref 0.0–0.2)
Basos: 1 %
EOS (ABSOLUTE): 0.2 10*3/uL (ref 0.0–0.4)
Eos: 2 %
Hematocrit: 39.3 % (ref 34.0–46.6)
Hemoglobin: 13.4 g/dL (ref 11.1–15.9)
Immature Grans (Abs): 0 10*3/uL (ref 0.0–0.1)
Immature Granulocytes: 0 %
Lymphocytes Absolute: 2.5 10*3/uL (ref 0.7–3.1)
Lymphs: 35 %
MCH: 31.6 pg (ref 26.6–33.0)
MCHC: 34.1 g/dL (ref 31.5–35.7)
MCV: 93 fL (ref 79–97)
Monocytes Absolute: 0.7 10*3/uL (ref 0.1–0.9)
Monocytes: 9 %
Neutrophils Absolute: 3.7 10*3/uL (ref 1.4–7.0)
Neutrophils: 53 %
Platelets: 314 10*3/uL (ref 150–450)
RBC: 4.24 x10E6/uL (ref 3.77–5.28)
RDW: 12.2 % (ref 11.7–15.4)
WBC: 7.1 10*3/uL (ref 3.4–10.8)

## 2019-03-21 LAB — LIPID PANEL
Chol/HDL Ratio: 3 ratio (ref 0.0–4.4)
Cholesterol, Total: 219 mg/dL — ABNORMAL HIGH (ref 100–199)
HDL: 73 mg/dL (ref >39)
LDL Chol Calc (NIH): 109 mg/dL — ABNORMAL HIGH (ref 0–99)
Triglycerides: 220 mg/dL — ABNORMAL HIGH (ref 0–149)
VLDL Cholesterol Cal: 37 mg/dL (ref 5–40)

## 2019-03-21 LAB — TSH: TSH: 5.69 u[IU]/mL — ABNORMAL HIGH (ref 0.450–4.500)

## 2019-04-03 ENCOUNTER — Telehealth: Payer: Self-pay | Admitting: Family Medicine

## 2019-04-03 NOTE — Telephone Encounter (Signed)
Received a call form [facility] in reference to a continuity of care records request we sent. Facility states that they are requiring a signed medical records request.

## 2019-05-20 ENCOUNTER — Other Ambulatory Visit: Payer: BC Managed Care – PPO

## 2019-05-21 ENCOUNTER — Other Ambulatory Visit: Payer: Self-pay

## 2019-05-21 ENCOUNTER — Other Ambulatory Visit: Payer: BC Managed Care – PPO

## 2019-06-24 DIAGNOSIS — Z01419 Encounter for gynecological examination (general) (routine) without abnormal findings: Secondary | ICD-10-CM | POA: Diagnosis not present

## 2019-06-24 DIAGNOSIS — Z13820 Encounter for screening for osteoporosis: Secondary | ICD-10-CM | POA: Diagnosis not present

## 2019-06-24 DIAGNOSIS — Z1231 Encounter for screening mammogram for malignant neoplasm of breast: Secondary | ICD-10-CM | POA: Diagnosis not present

## 2019-06-24 DIAGNOSIS — Z6823 Body mass index (BMI) 23.0-23.9, adult: Secondary | ICD-10-CM | POA: Diagnosis not present

## 2019-06-24 LAB — HM PAP SMEAR

## 2019-06-24 LAB — RESULTS CONSOLE HPV: CHL HPV: NEGATIVE

## 2019-06-25 ENCOUNTER — Telehealth: Payer: Self-pay | Admitting: Family Medicine

## 2019-06-25 ENCOUNTER — Other Ambulatory Visit: Payer: Self-pay | Admitting: Obstetrics and Gynecology

## 2019-06-25 DIAGNOSIS — R928 Other abnormal and inconclusive findings on diagnostic imaging of breast: Secondary | ICD-10-CM

## 2019-06-25 NOTE — Telephone Encounter (Signed)
Requested records received from [facility] °

## 2019-06-26 ENCOUNTER — Encounter: Payer: Self-pay | Admitting: *Deleted

## 2019-07-03 ENCOUNTER — Other Ambulatory Visit: Payer: Self-pay

## 2019-07-03 ENCOUNTER — Ambulatory Visit
Admission: RE | Admit: 2019-07-03 | Discharge: 2019-07-03 | Disposition: A | Discharge status: Home / Self Care | Payer: BLUE CROSS/BLUE SHIELD | Source: Ambulatory Visit | Attending: Obstetrics and Gynecology | Admitting: Obstetrics and Gynecology

## 2019-07-03 ENCOUNTER — Ambulatory Visit: Payer: BLUE CROSS/BLUE SHIELD

## 2019-07-03 ENCOUNTER — Other Ambulatory Visit: Payer: Self-pay | Admitting: Family Medicine

## 2019-07-03 DIAGNOSIS — R928 Other abnormal and inconclusive findings on diagnostic imaging of breast: Secondary | ICD-10-CM

## 2019-07-03 DIAGNOSIS — R922 Inconclusive mammogram: Secondary | ICD-10-CM | POA: Diagnosis not present

## 2019-07-03 DIAGNOSIS — N6489 Other specified disorders of breast: Secondary | ICD-10-CM

## 2019-07-03 DIAGNOSIS — R921 Mammographic calcification found on diagnostic imaging of breast: Secondary | ICD-10-CM | POA: Diagnosis not present

## 2019-07-03 LAB — HM MAMMOGRAPHY

## 2019-07-07 ENCOUNTER — Other Ambulatory Visit: Payer: Self-pay

## 2019-07-07 ENCOUNTER — Ambulatory Visit
Admission: RE | Admit: 2019-07-07 | Discharge: 2019-07-07 | Disposition: A | Discharge status: Home / Self Care | Payer: BC Managed Care – PPO | Source: Ambulatory Visit | Attending: Family Medicine | Admitting: Family Medicine

## 2019-07-07 DIAGNOSIS — N6489 Other specified disorders of breast: Secondary | ICD-10-CM

## 2019-07-07 DIAGNOSIS — R928 Other abnormal and inconclusive findings on diagnostic imaging of breast: Secondary | ICD-10-CM | POA: Diagnosis not present

## 2019-07-07 DIAGNOSIS — N6012 Diffuse cystic mastopathy of left breast: Secondary | ICD-10-CM | POA: Diagnosis not present

## 2019-07-07 HISTORY — PX: BIOPSY BREAST: PRO8

## 2019-07-14 DIAGNOSIS — N6019 Diffuse cystic mastopathy of unspecified breast: Secondary | ICD-10-CM | POA: Insufficient documentation

## 2019-07-21 ENCOUNTER — Encounter: Payer: Self-pay | Admitting: Family Medicine

## 2020-03-22 ENCOUNTER — Encounter: Payer: Self-pay | Admitting: Family Medicine

## 2020-03-22 ENCOUNTER — Ambulatory Visit: Payer: BC Managed Care – PPO | Admitting: Family Medicine

## 2020-03-22 ENCOUNTER — Other Ambulatory Visit: Payer: Self-pay

## 2020-03-22 VITALS — BP 140/82 | HR 64 | Temp 97.8°F | Ht 64.5 in | Wt 148.4 lb

## 2020-03-22 DIAGNOSIS — J309 Allergic rhinitis, unspecified: Secondary | ICD-10-CM | POA: Diagnosis not present

## 2020-03-22 DIAGNOSIS — R030 Elevated blood-pressure reading, without diagnosis of hypertension: Secondary | ICD-10-CM | POA: Diagnosis not present

## 2020-03-22 DIAGNOSIS — E039 Hypothyroidism, unspecified: Secondary | ICD-10-CM | POA: Diagnosis not present

## 2020-03-22 DIAGNOSIS — M8580 Other specified disorders of bone density and structure, unspecified site: Secondary | ICD-10-CM

## 2020-03-22 DIAGNOSIS — Z0000 Encounter for general adult medical examination without abnormal findings: Secondary | ICD-10-CM

## 2020-03-22 DIAGNOSIS — Z1159 Encounter for screening for other viral diseases: Secondary | ICD-10-CM

## 2020-03-22 DIAGNOSIS — R232 Flushing: Secondary | ICD-10-CM

## 2020-03-22 DIAGNOSIS — Z23 Encounter for immunization: Secondary | ICD-10-CM | POA: Diagnosis not present

## 2020-03-22 MED ORDER — SYNTHROID 125 MCG PO TABS: 125.0000 ug | ORAL_TABLET | Freq: Every day | ORAL | 3 refills | Status: DC

## 2020-03-22 NOTE — Progress Notes (Signed)
   Subjective:    Patient ID: Carly Villa, female    DOB: Apr 06, 1966, 54 y.o.   MRN: [id number]  HPI She is here for complete examination.  She continues on Synthroid and is not having any difficulty.  She has had difficulty with hot flashes and is presently on HRT from her gynecologist.  Also apparently she does have osteopenia and is using calcium and vitamin D supplementation.  She is having no difficulty from her allergies.  Her work and home life are going well.  Family and social history as well as health maintenance and immunizations was reviewed.   Review of Systems  All other systems reviewed and are negative.      Objective:   Physical Exam  Alert and in no distress. Tympanic membranes and canals are normal. Pharyngeal area is normal. Neck is supple without adenopathy or thyromegaly. Cardiac exam shows a regular sinus rhythm without murmurs or gallops. Lungs are clear to auscultation. Abdominal exam shows no masses or tenderness with normal bowel sounds       Assessment & Plan:  Routine general medical examination at a health care facility - Plan: CBC with Differential/Platelet, Comprehensive metabolic panel, Lipid panel  Allergic rhinitis, mild  Hypothyroidism, unspecified type - Plan: TSH, SYNTHROID 125 MCG tablet  Osteopenia, unspecified location  Elevated blood pressure reading - Plan: CBC with Differential/Platelet, Comprehensive metabolic panel  Hot flashes  Need for hepatitis C screening test - Plan: Hepatitis C antibody  Immunization, viral disease - Plan: Pfizer SARS-COV-2 Vaccine  She will continue on her present medication regimen.  Discussed treatment of her blood pressure but did recommend getting back on her regular exercise program which she has been off through the Covid epidemic and also gave her the DASH diet.  We will recheck this in approximately 4 months.  She was comfortable with that.

## 2020-03-22 NOTE — Patient Instructions (Signed)
Check out the DASH diet.  Get back into regular exercise program and we can check you 3 to 4 months

## 2020-03-23 ENCOUNTER — Telehealth: Payer: Self-pay | Admitting: Family Medicine

## 2020-03-23 LAB — CBC WITH DIFFERENTIAL/PLATELET
Basophils Absolute: 0.1 10*3/uL (ref 0.0–0.2)
Basos: 1 %
EOS (ABSOLUTE): 0.2 10*3/uL (ref 0.0–0.4)
Eos: 2 %
Hematocrit: 34.4 % (ref 34.0–46.6)
Hemoglobin: 11.7 g/dL (ref 11.1–15.9)
Immature Grans (Abs): 0 10*3/uL (ref 0.0–0.1)
Immature Granulocytes: 0 %
Lymphocytes Absolute: 3 10*3/uL (ref 0.7–3.1)
Lymphs: 34 %
MCH: 31.8 pg (ref 26.6–33.0)
MCHC: 34 g/dL (ref 31.5–35.7)
MCV: 94 fL (ref 79–97)
Monocytes Absolute: 0.8 10*3/uL (ref 0.1–0.9)
Monocytes: 9 %
Neutrophils Absolute: 5 10*3/uL (ref 1.4–7.0)
Neutrophils: 54 %
Platelets: 357 10*3/uL (ref 150–450)
RBC: 3.68 x10E6/uL — ABNORMAL LOW (ref 3.77–5.28)
RDW: 12.6 % (ref 11.7–15.4)
WBC: 9 10*3/uL (ref 3.4–10.8)

## 2020-03-23 LAB — HEPATITIS C ANTIBODY: Hep C Virus Ab: <0.1 {s_co_ratio} (ref 0.0–0.9)

## 2020-03-23 LAB — COMPREHENSIVE METABOLIC PANEL
ALT: 33 IU/L — ABNORMAL HIGH (ref 0–32)
AST: 27 IU/L (ref 0–40)
Albumin/Globulin Ratio: 1.8 (ref 1.2–2.2)
Albumin: 4.3 g/dL (ref 3.8–4.9)
Alkaline Phosphatase: 59 IU/L (ref 44–121)
BUN/Creatinine Ratio: 22 (ref 9–23)
BUN: 13 mg/dL (ref 6–24)
Bilirubin Total: <0.2 mg/dL (ref 0.0–1.2)
CO2: 21 mmol/L (ref 20–29)
Calcium: 9 mg/dL (ref 8.7–10.2)
Chloride: 101 mmol/L (ref 96–106)
Creatinine, Ser: 0.58 mg/dL (ref 0.57–1.00)
GFR calc Af Amer: 123 mL/min/{1.73_m2} (ref >59)
GFR calc non Af Amer: 106 mL/min/{1.73_m2} (ref >59)
Globulin, Total: 2.4 g/dL (ref 1.5–4.5)
Glucose: 81 mg/dL (ref 65–99)
Potassium: 4.4 mmol/L (ref 3.5–5.2)
Sodium: 134 mmol/L (ref 134–144)
Total Protein: 6.7 g/dL (ref 6.0–8.5)

## 2020-03-23 LAB — LIPID PANEL
Chol/HDL Ratio: 3 ratio (ref 0.0–4.4)
Cholesterol, Total: 203 mg/dL — ABNORMAL HIGH (ref 100–199)
HDL: 67 mg/dL (ref >39)
LDL Chol Calc (NIH): 93 mg/dL (ref 0–99)
Triglycerides: 259 mg/dL — ABNORMAL HIGH (ref 0–149)
VLDL Cholesterol Cal: 43 mg/dL — ABNORMAL HIGH (ref 5–40)

## 2020-03-23 LAB — TSH: TSH: 3.08 u[IU]/mL (ref 0.450–4.500)

## 2020-03-23 NOTE — Telephone Encounter (Signed)
Received requested records from [facility]  

## 2020-04-01 ENCOUNTER — Encounter: Payer: Self-pay | Admitting: Family Medicine

## 2020-04-22 IMAGING — MG DIGITAL SCREENING BILATERAL MAMMOGRAM WITH CAD
4 series · 4 of 4 positions shown · non-contrast
Comparison: Previous exam(s).

CLINICAL DATA: Screening.

EXAM:
DIGITAL SCREENING BILATERAL MAMMOGRAM WITH CAD

[L CC]
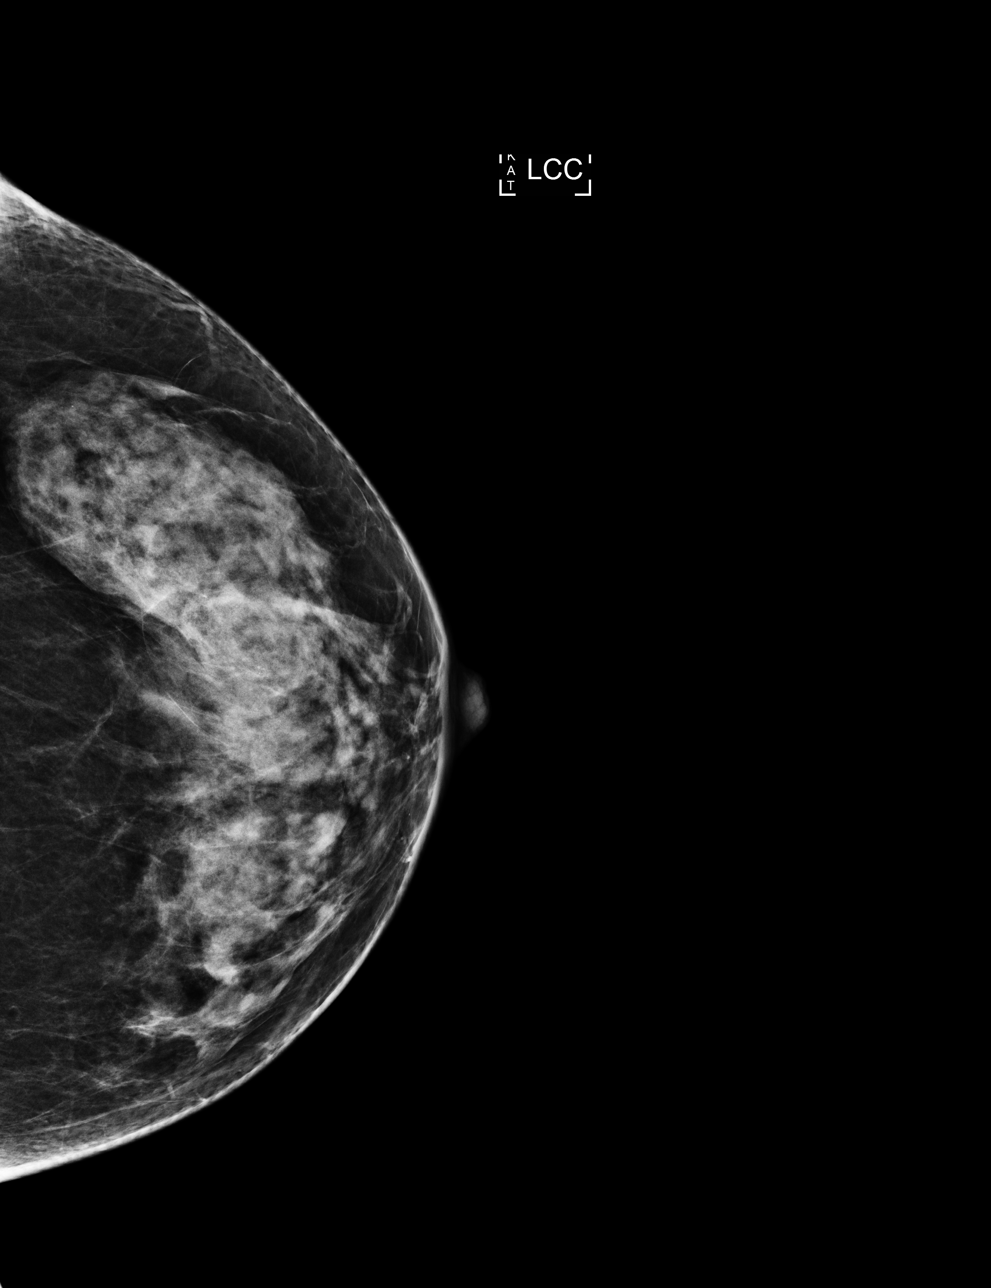

[R MLO]
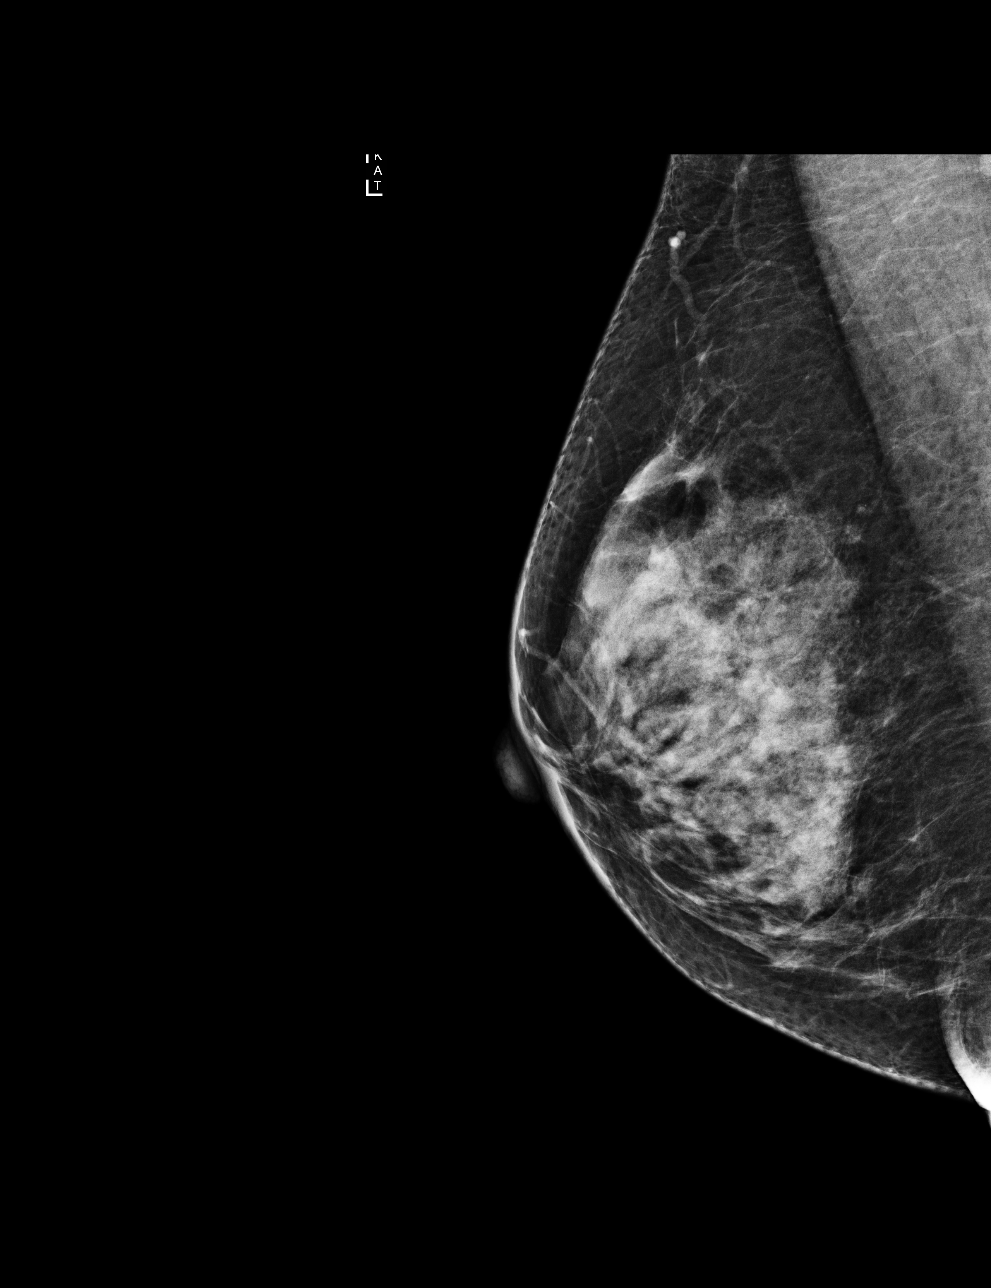

[R CC]
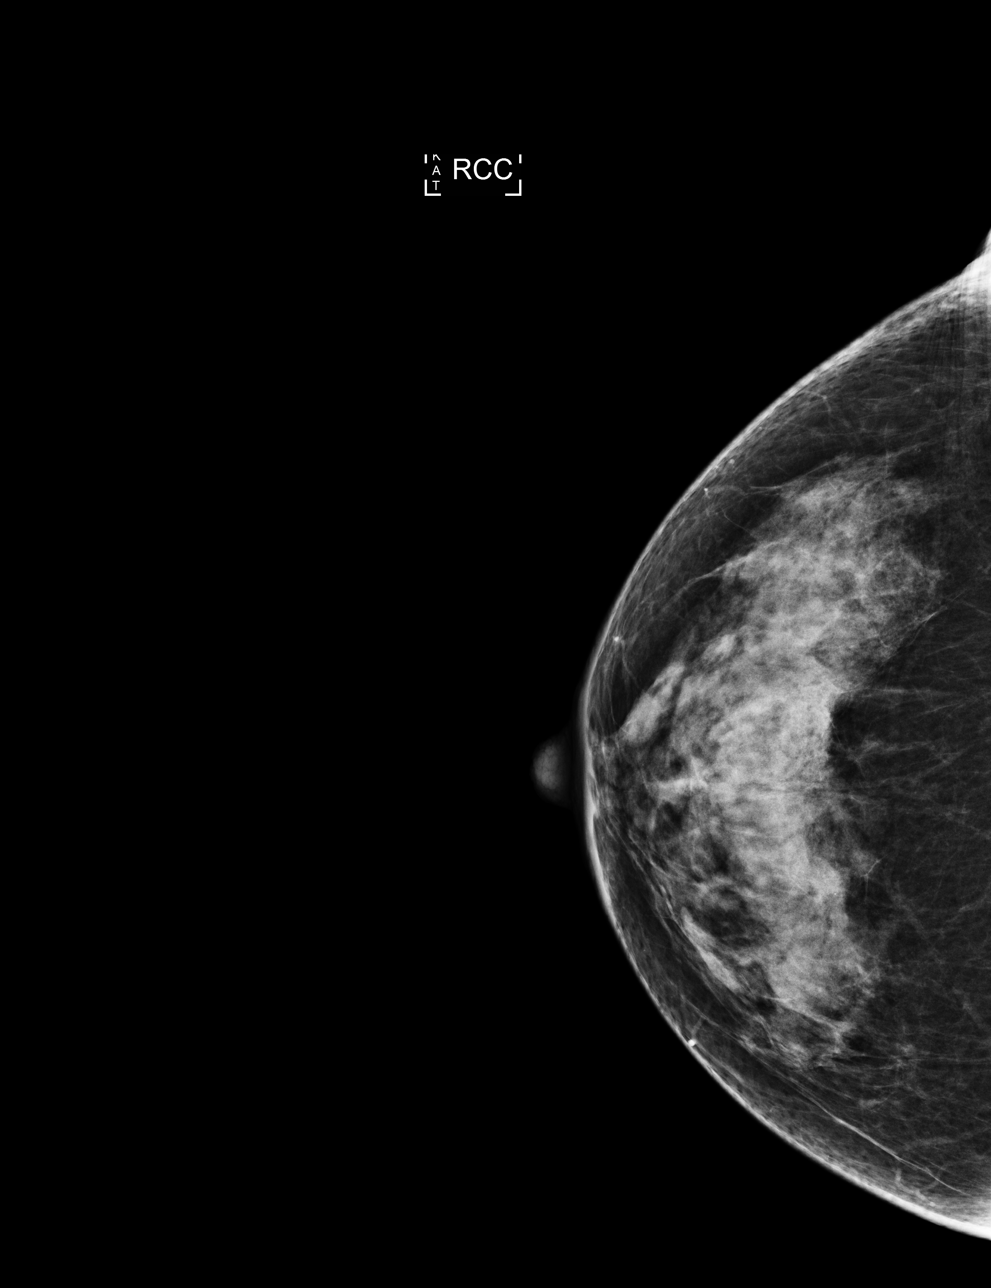

[L MLO]
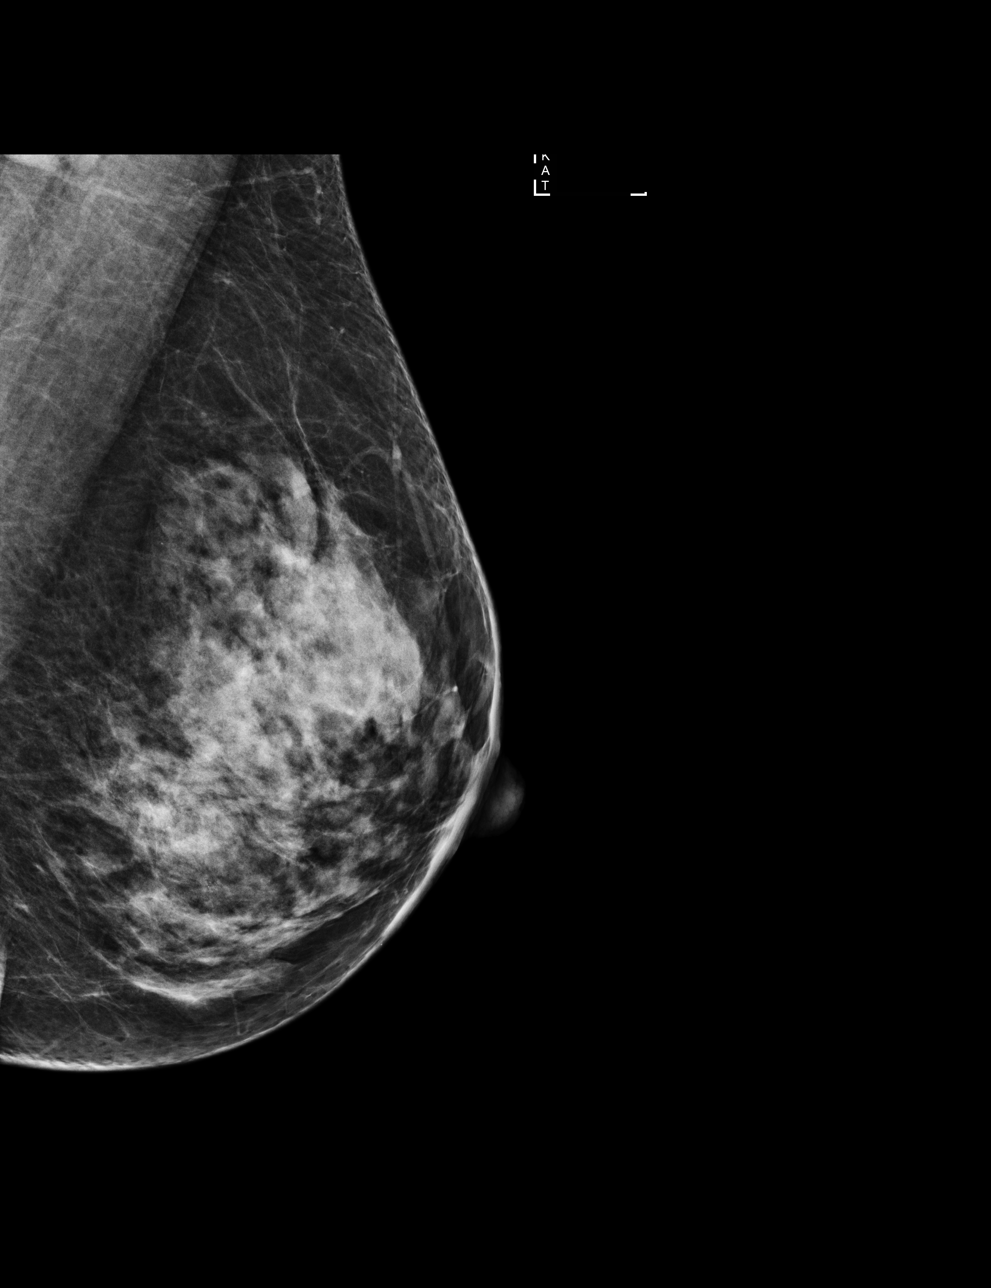

[4 of 4 positions shown; findings below may reference images not displayed]

ACR Breast Density Category c: The breast tissue is heterogeneously
dense, which may obscure small masses.
FINDINGS: There are no findings suspicious for malignancy. Images were
processed with CAD.
IMPRESSION: No mammographic evidence of malignancy. A result letter of this
screening mammogram will be mailed directly to the patient.

RECOMMENDATION:
Screening mammogram in one year. (Code:YJ-2-FEZ)

BI-RADS CATEGORY  1: Negative.

## 2020-07-20 ENCOUNTER — Ambulatory Visit: Payer: BC Managed Care – PPO | Admitting: Family Medicine

## 2020-07-20 ENCOUNTER — Other Ambulatory Visit: Payer: Self-pay

## 2020-07-20 VITALS — BP 146/92 | HR 70 | Temp 97.8°F | Wt 151.4 lb

## 2020-07-20 DIAGNOSIS — I10 Essential (primary) hypertension: Secondary | ICD-10-CM

## 2020-07-20 MED ORDER — CHLORTHALIDONE 25 MG PO TABS: 25.0000 mg | ORAL_TABLET | Freq: Every day | ORAL | 1 refills | Status: DC

## 2020-07-20 NOTE — Patient Instructions (Addendum)
Omron is a brand name of a good blood pressure cuff Reassuring for a blood pressure of 130/80 or lower

## 2020-07-20 NOTE — Progress Notes (Signed)
   Subjective:    Patient ID: Carly Villa, female    DOB: 25-Dec-1965, 55 y.o.   MRN: [id number]  HPI She is here for a blood pressure check.  She has made some diet and exercise changes.  She has no other concerns or complaints.   Review of Systems     Objective:   Physical Exam Alert and in no distress.  Blood pressure is recorded       Assessment & Plan:  Essential hypertension - Plan: chlorthalidone (HYGROTON) 25 MG tablet I will place her on hypertonic.  Explained how to check her blood pressure and she will return here in 1 month.  Recommend she buy a cuff so we can check it.

## 2020-08-19 DIAGNOSIS — L603 Nail dystrophy: Secondary | ICD-10-CM | POA: Diagnosis not present

## 2020-08-19 DIAGNOSIS — Z79899 Other long term (current) drug therapy: Secondary | ICD-10-CM | POA: Diagnosis not present

## 2020-08-23 ENCOUNTER — Ambulatory Visit: Payer: BC Managed Care – PPO | Admitting: Family Medicine

## 2020-08-31 DIAGNOSIS — L603 Nail dystrophy: Secondary | ICD-10-CM | POA: Diagnosis not present

## 2020-09-14 DIAGNOSIS — Z1231 Encounter for screening mammogram for malignant neoplasm of breast: Secondary | ICD-10-CM | POA: Diagnosis not present

## 2020-09-14 DIAGNOSIS — Z01419 Encounter for gynecological examination (general) (routine) without abnormal findings: Secondary | ICD-10-CM | POA: Diagnosis not present

## 2020-09-14 DIAGNOSIS — R6882 Decreased libido: Secondary | ICD-10-CM | POA: Diagnosis not present

## 2020-09-14 DIAGNOSIS — Z6824 Body mass index (BMI) 24.0-24.9, adult: Secondary | ICD-10-CM | POA: Diagnosis not present

## 2020-09-23 ENCOUNTER — Ambulatory Visit: Payer: BC Managed Care – PPO | Admitting: Family Medicine

## 2020-09-30 ENCOUNTER — Other Ambulatory Visit: Payer: Self-pay | Admitting: Family Medicine

## 2020-09-30 DIAGNOSIS — I10 Essential (primary) hypertension: Secondary | ICD-10-CM

## 2020-09-30 DIAGNOSIS — N950 Postmenopausal bleeding: Secondary | ICD-10-CM | POA: Diagnosis not present

## 2020-10-19 DIAGNOSIS — B351 Tinea unguium: Secondary | ICD-10-CM | POA: Diagnosis not present

## 2020-11-01 DIAGNOSIS — D225 Melanocytic nevi of trunk: Secondary | ICD-10-CM | POA: Diagnosis not present

## 2020-11-01 DIAGNOSIS — L821 Other seborrheic keratosis: Secondary | ICD-10-CM | POA: Diagnosis not present

## 2020-11-01 DIAGNOSIS — L578 Other skin changes due to chronic exposure to nonionizing radiation: Secondary | ICD-10-CM | POA: Diagnosis not present

## 2020-11-01 DIAGNOSIS — L814 Other melanin hyperpigmentation: Secondary | ICD-10-CM | POA: Diagnosis not present

## 2021-04-06 ENCOUNTER — Ambulatory Visit: Payer: BC Managed Care – PPO | Admitting: Family Medicine

## 2021-04-06 ENCOUNTER — Encounter: Payer: Self-pay | Admitting: Family Medicine

## 2021-04-06 ENCOUNTER — Other Ambulatory Visit: Payer: Self-pay

## 2021-04-06 VITALS — BP 142/86 | HR 68 | Temp 97.3°F | Ht 64.5 in | Wt 147.6 lb

## 2021-04-06 DIAGNOSIS — J309 Allergic rhinitis, unspecified: Secondary | ICD-10-CM

## 2021-04-06 DIAGNOSIS — E039 Hypothyroidism, unspecified: Secondary | ICD-10-CM | POA: Diagnosis not present

## 2021-04-06 DIAGNOSIS — M8580 Other specified disorders of bone density and structure, unspecified site: Secondary | ICD-10-CM | POA: Diagnosis not present

## 2021-04-06 DIAGNOSIS — Z13220 Encounter for screening for lipoid disorders: Secondary | ICD-10-CM

## 2021-04-06 DIAGNOSIS — N6019 Diffuse cystic mastopathy of unspecified breast: Secondary | ICD-10-CM | POA: Diagnosis not present

## 2021-04-06 DIAGNOSIS — Z1211 Encounter for screening for malignant neoplasm of colon: Secondary | ICD-10-CM

## 2021-04-06 NOTE — Progress Notes (Signed)
   Subjective:    Patient ID: Carly Villa, female    DOB: 1965/07/23, 55 y.o.   MRN: [id number]  HPI She is here for an interval evaluation.  She continues on her thyroid medication and is having no difficulty with that.  Her allergies seem to be under good control.  She does have osteopenia and is being followed by her gynecologist for this.  She gets regular gynecologic evaluations.  She does have fibrocystic disease and apparently has had a biopsy.  She is on multivitamin as well as extra calcium and vitamin D.   Review of Systems     Objective:   Physical Exam Alert and in no distress. Tympanic membranes and canals are normal. Pharyngeal area is normal. Neck is supple without adenopathy or thyromegaly. Cardiac exam shows a regular sinus rhythm without murmurs or gallops. Lungs are clear to auscultation.        Assessment & Plan:  Hypothyroidism, unspecified type - Plan: TSH  Allergic rhinitis, mild  Osteopenia, unspecified location - Plan: CBC with Differential/Platelet, Comprehensive metabolic panel  Fibrocystic breast changes, unspecified laterality  Screening for colon cancer - Plan: Cologuard  Screening for lipid disorders - Plan: Lipid panel She will follow-up with her gynecologist concerning Pap, pelvic, mammogram and DEXA scanning. Continue to treat allergies as needed. She will need a Cologuard in February and I will therefore order it now.

## 2021-04-07 LAB — LIPID PANEL
Chol/HDL Ratio: 2.8 ratio (ref 0.0–4.4)
Cholesterol, Total: 213 mg/dL — ABNORMAL HIGH (ref 100–199)
HDL: 75 mg/dL (ref >39)
LDL Chol Calc (NIH): 110 mg/dL — ABNORMAL HIGH (ref 0–99)
Triglycerides: 162 mg/dL — ABNORMAL HIGH (ref 0–149)
VLDL Cholesterol Cal: 28 mg/dL (ref 5–40)

## 2021-04-07 LAB — CBC WITH DIFFERENTIAL/PLATELET
Basophils Absolute: 0.1 10*3/uL (ref 0.0–0.2)
Basos: 1 %
EOS (ABSOLUTE): 0.2 10*3/uL (ref 0.0–0.4)
Eos: 2 %
Hematocrit: 34.6 % (ref 34.0–46.6)
Hemoglobin: 11.5 g/dL (ref 11.1–15.9)
Immature Grans (Abs): 0 10*3/uL (ref 0.0–0.1)
Immature Granulocytes: 0 %
Lymphocytes Absolute: 2.9 10*3/uL (ref 0.7–3.1)
Lymphs: 28 %
MCH: 30.9 pg (ref 26.6–33.0)
MCHC: 33.2 g/dL (ref 31.5–35.7)
MCV: 93 fL (ref 79–97)
Monocytes Absolute: 0.7 10*3/uL (ref 0.1–0.9)
Monocytes: 7 %
Neutrophils Absolute: 6.3 10*3/uL (ref 1.4–7.0)
Neutrophils: 62 %
Platelets: 389 10*3/uL (ref 150–450)
RBC: 3.72 x10E6/uL — ABNORMAL LOW (ref 3.77–5.28)
RDW: 12.2 % (ref 11.7–15.4)
WBC: 10.2 10*3/uL (ref 3.4–10.8)

## 2021-04-07 LAB — COMPREHENSIVE METABOLIC PANEL
ALT: 15 IU/L (ref 0–32)
AST: 14 IU/L (ref 0–40)
Albumin/Globulin Ratio: 2.4 — ABNORMAL HIGH (ref 1.2–2.2)
Albumin: 4.5 g/dL (ref 3.8–4.9)
Alkaline Phosphatase: 62 IU/L (ref 44–121)
BUN/Creatinine Ratio: 15 (ref 9–23)
BUN: 9 mg/dL (ref 6–24)
Bilirubin Total: 0.2 mg/dL (ref 0.0–1.2)
CO2: 22 mmol/L (ref 20–29)
Calcium: 9.1 mg/dL (ref 8.7–10.2)
Chloride: 98 mmol/L (ref 96–106)
Creatinine, Ser: 0.59 mg/dL (ref 0.57–1.00)
Globulin, Total: 1.9 g/dL (ref 1.5–4.5)
Glucose: 86 mg/dL (ref 70–99)
Potassium: 4.8 mmol/L (ref 3.5–5.2)
Sodium: 138 mmol/L (ref 134–144)
Total Protein: 6.4 g/dL (ref 6.0–8.5)
eGFR: 108 mL/min/{1.73_m2} (ref >59)

## 2021-04-07 LAB — TSH: TSH: 30.5 u[IU]/mL — ABNORMAL HIGH (ref 0.450–4.500)

## 2021-04-08 ENCOUNTER — Other Ambulatory Visit: Payer: Self-pay | Admitting: Family Medicine

## 2021-04-08 DIAGNOSIS — E039 Hypothyroidism, unspecified: Secondary | ICD-10-CM

## 2021-04-11 ENCOUNTER — Other Ambulatory Visit: Payer: Self-pay

## 2021-04-11 ENCOUNTER — Telehealth: Payer: Self-pay

## 2021-04-11 DIAGNOSIS — E039 Hypothyroidism, unspecified: Secondary | ICD-10-CM

## 2021-04-11 NOTE — Telephone Encounter (Signed)
Pt was out of her thyroid med when she had her labs done. Just got  a refill request for 125 mcg synthroid. Pleas advise if you want pt to stay at this dose.   Blima Singer RMA

## 2021-06-13 ENCOUNTER — Other Ambulatory Visit: Payer: Self-pay

## 2021-06-13 ENCOUNTER — Other Ambulatory Visit: Payer: BC Managed Care – PPO

## 2021-06-13 DIAGNOSIS — E039 Hypothyroidism, unspecified: Secondary | ICD-10-CM

## 2021-06-14 LAB — TSH: TSH: 3.98 u[IU]/mL (ref 0.450–4.500)

## 2021-07-25 DIAGNOSIS — Z1211 Encounter for screening for malignant neoplasm of colon: Secondary | ICD-10-CM | POA: Diagnosis not present

## 2021-08-01 LAB — COLOGUARD: COLOGUARD: NEGATIVE

## 2021-10-11 ENCOUNTER — Other Ambulatory Visit: Payer: Self-pay | Admitting: Family Medicine

## 2021-10-11 DIAGNOSIS — E039 Hypothyroidism, unspecified: Secondary | ICD-10-CM

## 2021-11-16 DIAGNOSIS — L578 Other skin changes due to chronic exposure to nonionizing radiation: Secondary | ICD-10-CM | POA: Diagnosis not present

## 2021-11-16 DIAGNOSIS — D225 Melanocytic nevi of trunk: Secondary | ICD-10-CM | POA: Diagnosis not present

## 2021-11-16 DIAGNOSIS — L821 Other seborrheic keratosis: Secondary | ICD-10-CM | POA: Diagnosis not present

## 2021-11-16 DIAGNOSIS — L814 Other melanin hyperpigmentation: Secondary | ICD-10-CM | POA: Diagnosis not present

## 2022-01-09 DIAGNOSIS — Z1231 Encounter for screening mammogram for malignant neoplasm of breast: Secondary | ICD-10-CM | POA: Diagnosis not present

## 2022-01-09 DIAGNOSIS — Z6824 Body mass index (BMI) 24.0-24.9, adult: Secondary | ICD-10-CM | POA: Diagnosis not present

## 2022-01-09 DIAGNOSIS — Z13820 Encounter for screening for osteoporosis: Secondary | ICD-10-CM | POA: Diagnosis not present

## 2022-01-09 DIAGNOSIS — Z01419 Encounter for gynecological examination (general) (routine) without abnormal findings: Secondary | ICD-10-CM | POA: Diagnosis not present

## 2022-01-09 LAB — HM MAMMOGRAPHY

## 2022-01-09 LAB — HM DEXA SCAN

## 2022-01-25 ENCOUNTER — Encounter: Payer: Self-pay | Admitting: Internal Medicine

## 2022-01-31 ENCOUNTER — Encounter: Payer: BC Managed Care – PPO | Admitting: Family Medicine

## 2022-02-09 ENCOUNTER — Encounter: Payer: Self-pay | Admitting: Family Medicine

## 2022-02-09 ENCOUNTER — Ambulatory Visit (INDEPENDENT_AMBULATORY_CARE_PROVIDER_SITE_OTHER): Payer: BC Managed Care – PPO | Admitting: Family Medicine

## 2022-02-09 VITALS — BP 138/74 | HR 59 | Temp 97.5°F | Ht 64.75 in | Wt 150.0 lb

## 2022-02-09 DIAGNOSIS — R030 Elevated blood-pressure reading, without diagnosis of hypertension: Secondary | ICD-10-CM | POA: Diagnosis not present

## 2022-02-09 DIAGNOSIS — Z0000 Encounter for general adult medical examination without abnormal findings: Secondary | ICD-10-CM | POA: Diagnosis not present

## 2022-02-09 DIAGNOSIS — M8580 Other specified disorders of bone density and structure, unspecified site: Secondary | ICD-10-CM

## 2022-02-09 DIAGNOSIS — Z23 Encounter for immunization: Secondary | ICD-10-CM | POA: Diagnosis not present

## 2022-02-09 DIAGNOSIS — J309 Allergic rhinitis, unspecified: Secondary | ICD-10-CM

## 2022-02-09 DIAGNOSIS — E039 Hypothyroidism, unspecified: Secondary | ICD-10-CM

## 2022-02-09 DIAGNOSIS — Z13220 Encounter for screening for lipoid disorders: Secondary | ICD-10-CM

## 2022-02-09 MED ORDER — SYNTHROID 125 MCG PO TABS: 125.0000 ug | ORAL_TABLET | Freq: Every day | ORAL | 3 refills | Status: DC

## 2022-02-09 NOTE — Patient Instructions (Signed)

## 2022-02-09 NOTE — Progress Notes (Signed)
Complete physical exam  Patient: Carly Villa   DOB: Aug 10, 1965   56 y.o. Female  MRN: [id number]  Subjective:    Chief Complaint  Patient presents with   Annual Exam    Fasting     Carly Villa is a 56 y.o. female who presents today for a complete physical exam. She reports consuming a general diet. Home exercise routine includes walking 7 hrs per week. She generally feels well. She reports sleeping well. She does not have additional problems to discuss today.  She is not taking her chlorthalidone.  She does plan to make further diet and exercise changes to see if that will help with her weight and her blood pressure.  She recently saw her gynecologist and did have mammogram and bone density.  Apparently she does have osteopenia and is taking a multivitamin with vitamin D.  She continues on Synthroid.  Her work and home life are going well. Otherwise family and social history as well as health maintenance and immunizations was reviewed  Most recent fall risk assessment:    03/13/2017    3:08 PM  [place] in the past year? No     Most recent depression screenings:    02/09/2022    8:32 AM 04/06/2021   11:16 AM  PHQ 2/9 Scores  PHQ - 2 Score 0 0    Vision:Within last year  Patient Active Problem List   Diagnosis Date Noted   Fibrocystic breast changes 07/14/2019   Lipoma of torso 03/20/2019   Osteopenia 03/18/2018   Former smoker 02/02/2015   Allergic rhinitis, mild 10/24/2011   Hypothyroidism 10/24/2011   Past Medical History:  Diagnosis Date   Allergy    Hypothyroid    Past Surgical History:  Procedure Laterality Date   BIOPSY BREAST Left 07/07/2019   fibrocystic change, no malignancy identified   Social History   Tobacco Use   Smoking status: Former    Packs/day: 0.10    Types: Cigarettes    Quit date: 03/18/2010    Years since quitting: 11.9   Smokeless tobacco: Never  Vaping Use   Vaping Use: Never used  Substance Use Topics    Alcohol use: Yes    Alcohol/week: 5.0 - 6.0 standard drinks of alcohol    Types: 5 - 6 Standard drinks or equivalent per week    Comment: WEEK   Drug use: No   Family History  Problem Relation Age of Onset   Hypertension Mother    Stroke Mother    Allergies  Allergen Reactions   Penicillins       Patient Care Team: Denita Lung, MD as PCP - General (Family Medicine)   Outpatient Medications Prior to Visit  Medication Sig   calcium carbonate (OS-CAL) 1250 MG chewable tablet Chew 1 tablet by mouth daily.   Cholecalciferol (VITAMIN D) 2000 UNITS CAPS Take by mouth.   estradiol (ESTRACE) 2 MG tablet Take 2 mg by mouth daily.   fish oil-omega-3 fatty acids 1000 MG capsule Take 2 g by mouth daily.   Multiple Vitamins-Minerals (MULTIVITAMIN WITH MINERALS) tablet Take 1 tablet by mouth daily.   progesterone (PROMETRIUM) 100 MG capsule Take 100 mg by mouth at bedtime.   SYNTHROID 125 MCG tablet TAKE 1 TABLET BY MOUTH EVERY DAY   chlorthalidone (HYGROTON) 25 MG tablet TAKE 1 TABLET (25 MG TOTAL) BY MOUTH DAILY. (Patient not taking: Reported on 04/06/2021)   [DISCONTINUED] buPROPion (WELLBUTRIN XL) 150 MG 24  hr tablet bupropion HCl XL 150 mg 24 hr tablet, extended release (Patient not taking: Reported on 04/06/2021)   [DISCONTINUED] estradiol-norethindrone (COMBIPATCH) 0.05-0.14 MG/DAY Place 1 patch onto the skin 2 (two) times a week.   (Patient not taking: Reported on 03/22/2020)   [DISCONTINUED] terbinafine (LAMISIL) 250 MG tablet Take 250 mg by mouth once a week. (Patient not taking: Reported on 04/06/2021)   No facility-administered medications prior to visit.    Review of Systems  All other systems reviewed and are negative.         Objective:     BP 138/74   Pulse (!) 59   Temp (!) 97.5 F (36.4 C)   Ht 5' 4.75" (1.645 m)   Wt 150 lb (68 kg)   LMP 10/04/2010   SpO2 98%   BMI 25.15 kg/m  BP Readings from Last 3 Encounters:  02/09/22 138/74  04/06/21 (!) 142/86   07/20/20 (!) 146/92   Wt Readings from Last 3 Encounters:  02/09/22 150 lb (68 kg)  04/06/21 147 lb 9.6 oz (67 kg)  07/20/20 151 lb 6.4 oz (68.7 kg)      Physical Exam  Alert and in no distress. Tympanic membranes and canals are normal. Pharyngeal area is normal. Neck is supple without adenopathy or thyromegaly. Cardiac exam shows a regular sinus rhythm without murmurs or gallops. Lungs are clear to auscultation.  Last CBC Lab Results  Component Value Date   WBC 10.2 04/06/2021   HGB 11.5 04/06/2021   HCT 34.6 04/06/2021   MCV 93 04/06/2021   MCH 30.9 04/06/2021   RDW 12.2 04/06/2021   PLT 389 94/32/0037   Last metabolic panel Lab Results  Component Value Date   GLUCOSE 86 04/06/2021   NA 138 04/06/2021   K 4.8 04/06/2021   CL 98 04/06/2021   CO2 22 04/06/2021   BUN 9 04/06/2021   CREATININE 0.59 04/06/2021   EGFR 108 04/06/2021   CALCIUM 9.1 04/06/2021   PROT 6.4 04/06/2021   ALBUMIN 4.5 04/06/2021   LABGLOB 1.9 04/06/2021   AGRATIO 2.4 (H) 04/06/2021   BILITOT 0.2 04/06/2021   ALKPHOS 62 04/06/2021   AST 14 04/06/2021   ALT 15 04/06/2021   Last lipids Lab Results  Component Value Date   CHOL 213 (H) 04/06/2021   HDL 75 04/06/2021   LDLCALC 110 (H) 04/06/2021   TRIG 162 (H) 04/06/2021   CHOLHDL 2.8 04/06/2021   Last thyroid functions Lab Results  Component Value Date   TSH 3.980 06/13/2021        Assessment & Plan:     Routine general medical examination at a health care facility - Plan: CBC with Differential/Platelet, Comprehensive metabolic panel, Lipid panel  Allergic rhinitis, mild  Hypothyroidism, unspecified type - Plan: TSH, SYNTHROID 125 MCG tablet  Osteopenia, unspecified location  Screening for lipid disorders - Plan: Lipid panel  Elevated blood pressure reading - Plan: CBC with Differential/Platelet, Comprehensive metabolic panel  Immunization, viral disease - Plan: Flu Vaccine QUAD 46moIM (Fluarix, Fluzone & Alfiuria Quad  PF)  Immunization History  Administered Date(s) Administered   DTaP 07/24/2005   Influenza,inj,Quad PF,6+ Mos 03/15/2017, 03/18/2018, 03/20/2019   Influenza-Unspecified 03/01/2016, 03/13/2020, 02/24/2021   PFIZER Comirnaty(Gray Top)Covid-19 Tri-Sucrose Vaccine 01/01/2021   PFIZER(Purple Top)SARS-COV-2 Vaccination 08/04/2019, 08/25/2019, 03/22/2020   Tdap 03/02/2016    Health Maintenance  Topic Date Due   Zoster Vaccines- Shingrix (1 of 2) Never done   COVID-19 Vaccine (5 - Pfizer risk series) 02/26/2021  MAMMOGRAM  07/02/2021   INFLUENZA VACCINE  12/20/2021   HIV Screening  04/06/2022 (Originally 06/17/1982)   PAP SMEAR-Modifier  06/24/2022   Fecal DNA (Cologuard)  07/25/2024   TETANUS/TDAP  03/02/2026   Hepatitis C Screening  Completed   HPV VACCINES  Aged Out    Discussed health benefits of physical activity, and encouraged her to engage in regular exercise appropriate for her age and condition.  20 minutes of something physical or 150 minutes a week.  Also discussed dietary modification in terms of cutting back on carbohydrates.  Problem List Items Addressed This Visit     Allergic rhinitis, mild   Hypothyroidism   Relevant Orders   TSH   Osteopenia   Other Visit Diagnoses     Routine general medical examination at a health care facility    -  Primary   Relevant Orders   CBC with Differential/Platelet   Comprehensive metabolic panel   Lipid panel   Screening for lipid disorders       Relevant Orders   Lipid panel   Elevated blood pressure reading       Relevant Orders   CBC with Differential/Platelet   Comprehensive metabolic panel   Immunization, viral disease       Relevant Orders   Flu Vaccine QUAD 53moIM (Fluarix, Fluzone & Alfiuria Quad PF)      Return here in 2 months for blood pressure check.    JJill Alexanders MD

## 2022-02-10 LAB — COMPREHENSIVE METABOLIC PANEL
ALT: 15 IU/L (ref 0–32)
AST: 12 IU/L (ref 0–40)
Albumin/Globulin Ratio: 2 (ref 1.2–2.2)
Albumin: 4.3 g/dL (ref 3.8–4.9)
Alkaline Phosphatase: 56 IU/L (ref 44–121)
BUN/Creatinine Ratio: 19 (ref 9–23)
BUN: 10 mg/dL (ref 6–24)
Bilirubin Total: <0.2 mg/dL (ref 0.0–1.2)
CO2: 20 mmol/L (ref 20–29)
Calcium: 9.7 mg/dL (ref 8.7–10.2)
Chloride: 102 mmol/L (ref 96–106)
Creatinine, Ser: 0.52 mg/dL — ABNORMAL LOW (ref 0.57–1.00)
Globulin, Total: 2.2 g/dL (ref 1.5–4.5)
Glucose: 86 mg/dL (ref 70–99)
Potassium: 5 mmol/L (ref 3.5–5.2)
Sodium: 140 mmol/L (ref 134–144)
Total Protein: 6.5 g/dL (ref 6.0–8.5)
eGFR: 110 mL/min/{1.73_m2} (ref >59)

## 2022-02-10 LAB — CBC WITH DIFFERENTIAL/PLATELET
Basophils Absolute: 0.1 10*3/uL (ref 0.0–0.2)
Basos: 1 %
EOS (ABSOLUTE): 0.2 10*3/uL (ref 0.0–0.4)
Eos: 2 %
Hematocrit: 33.9 % — ABNORMAL LOW (ref 34.0–46.6)
Hemoglobin: 11.2 g/dL (ref 11.1–15.9)
Immature Grans (Abs): 0 10*3/uL (ref 0.0–0.1)
Immature Granulocytes: 0 %
Lymphocytes Absolute: 2.7 10*3/uL (ref 0.7–3.1)
Lymphs: 29 %
MCH: 31.2 pg (ref 26.6–33.0)
MCHC: 33 g/dL (ref 31.5–35.7)
MCV: 94 fL (ref 79–97)
Monocytes Absolute: 0.8 10*3/uL (ref 0.1–0.9)
Monocytes: 9 %
Neutrophils Absolute: 5.7 10*3/uL (ref 1.4–7.0)
Neutrophils: 59 %
Platelets: 421 10*3/uL (ref 150–450)
RBC: 3.59 x10E6/uL — ABNORMAL LOW (ref 3.77–5.28)
RDW: 12.7 % (ref 11.7–15.4)
WBC: 9.5 10*3/uL (ref 3.4–10.8)

## 2022-02-10 LAB — TSH: TSH: 2.73 u[IU]/mL (ref 0.450–4.500)

## 2022-02-10 LAB — LIPID PANEL
Chol/HDL Ratio: 2.9 ratio (ref 0.0–4.4)
Cholesterol, Total: 203 mg/dL — ABNORMAL HIGH (ref 100–199)
HDL: 69 mg/dL (ref >39)
LDL Chol Calc (NIH): 110 mg/dL — ABNORMAL HIGH (ref 0–99)
Triglycerides: 140 mg/dL (ref 0–149)
VLDL Cholesterol Cal: 24 mg/dL (ref 5–40)

## 2022-02-21 ENCOUNTER — Encounter: Payer: Self-pay | Admitting: Family Medicine

## 2022-02-28 ENCOUNTER — Encounter: Payer: Self-pay | Admitting: Internal Medicine

## 2022-03-13 ENCOUNTER — Encounter: Payer: Self-pay | Admitting: Internal Medicine

## 2022-04-10 ENCOUNTER — Ambulatory Visit: Payer: BC Managed Care – PPO | Admitting: Family Medicine

## 2022-04-10 VITALS — BP 140/82 | HR 68 | Wt 152.8 lb

## 2022-04-10 DIAGNOSIS — I10 Essential (primary) hypertension: Secondary | ICD-10-CM

## 2022-04-10 MED ORDER — HYDROCHLOROTHIAZIDE 12.5 MG PO TABS: 12.5000 mg | ORAL_TABLET | Freq: Every day | ORAL | 3 refills | Status: DC

## 2022-04-10 NOTE — Progress Notes (Signed)
   Subjective:    Patient ID: Carly Villa, female    DOB: 10-08-1965, 56 y.o.   MRN: [id number]  HPI She is here for recheck on her blood pressure.   Review of Systems     Objective:   Physical Exam Alert and in no distress.  Blood pressure is recorded.  The medical record was reviewed concerning her blood pressure       Assessment & Plan:  Essential hypertension - Plan: hydrochlorothiazide (HYDRODIURIL) 12.5 MG tablet I will start her slowly home blood pressure medications.  She is to return here in 1 month and bring her blood pressure cuff in so we can measure it against ours.  Cautioned her to take the medication at night since it is a diuretic.

## 2022-04-25 ENCOUNTER — Other Ambulatory Visit: Payer: Self-pay | Admitting: Family Medicine

## 2022-04-25 DIAGNOSIS — E039 Hypothyroidism, unspecified: Secondary | ICD-10-CM

## 2022-05-10 ENCOUNTER — Ambulatory Visit: Payer: BC Managed Care – PPO | Admitting: Family Medicine

## 2022-05-29 DIAGNOSIS — H5213 Myopia, bilateral: Secondary | ICD-10-CM | POA: Diagnosis not present

## 2022-05-29 DIAGNOSIS — H52203 Unspecified astigmatism, bilateral: Secondary | ICD-10-CM | POA: Diagnosis not present

## 2022-06-14 ENCOUNTER — Ambulatory Visit: Payer: BC Managed Care – PPO | Admitting: Family Medicine

## 2022-12-25 DIAGNOSIS — L304 Erythema intertrigo: Secondary | ICD-10-CM | POA: Diagnosis not present

## 2022-12-25 DIAGNOSIS — L578 Other skin changes due to chronic exposure to nonionizing radiation: Secondary | ICD-10-CM | POA: Diagnosis not present

## 2022-12-25 DIAGNOSIS — D225 Melanocytic nevi of trunk: Secondary | ICD-10-CM | POA: Diagnosis not present

## 2022-12-25 DIAGNOSIS — L814 Other melanin hyperpigmentation: Secondary | ICD-10-CM | POA: Diagnosis not present

## 2023-01-30 DIAGNOSIS — Z124 Encounter for screening for malignant neoplasm of cervix: Secondary | ICD-10-CM | POA: Diagnosis not present

## 2023-01-30 DIAGNOSIS — Z6825 Body mass index (BMI) 25.0-25.9, adult: Secondary | ICD-10-CM | POA: Diagnosis not present

## 2023-01-30 DIAGNOSIS — Z01419 Encounter for gynecological examination (general) (routine) without abnormal findings: Secondary | ICD-10-CM | POA: Diagnosis not present

## 2023-01-30 DIAGNOSIS — Z1231 Encounter for screening mammogram for malignant neoplasm of breast: Secondary | ICD-10-CM | POA: Diagnosis not present

## 2023-01-30 LAB — HM MAMMOGRAPHY

## 2023-01-31 ENCOUNTER — Encounter: Payer: Self-pay | Admitting: Family Medicine

## 2023-02-07 ENCOUNTER — Encounter: Payer: Self-pay | Admitting: Family Medicine

## 2023-02-15 ENCOUNTER — Encounter: Payer: BC Managed Care – PPO | Admitting: Family Medicine

## 2023-02-16 ENCOUNTER — Encounter: Payer: BC Managed Care – PPO | Admitting: Family Medicine

## 2023-03-08 ENCOUNTER — Ambulatory Visit: Payer: BC Managed Care – PPO | Admitting: Family Medicine

## 2023-03-08 ENCOUNTER — Encounter: Payer: Self-pay | Admitting: Family Medicine

## 2023-03-08 VITALS — BP 124/82 | HR 58 | Ht 65.5 in | Wt 147.6 lb

## 2023-03-08 DIAGNOSIS — E039 Hypothyroidism, unspecified: Secondary | ICD-10-CM | POA: Diagnosis not present

## 2023-03-08 DIAGNOSIS — M8580 Other specified disorders of bone density and structure, unspecified site: Secondary | ICD-10-CM

## 2023-03-08 DIAGNOSIS — I10 Essential (primary) hypertension: Secondary | ICD-10-CM

## 2023-03-08 DIAGNOSIS — E038 Other specified hypothyroidism: Secondary | ICD-10-CM | POA: Diagnosis not present

## 2023-03-08 DIAGNOSIS — Z131 Encounter for screening for diabetes mellitus: Secondary | ICD-10-CM

## 2023-03-08 DIAGNOSIS — J309 Allergic rhinitis, unspecified: Secondary | ICD-10-CM | POA: Diagnosis not present

## 2023-03-08 DIAGNOSIS — Z0000 Encounter for general adult medical examination without abnormal findings: Secondary | ICD-10-CM | POA: Diagnosis not present

## 2023-03-08 DIAGNOSIS — Z23 Encounter for immunization: Secondary | ICD-10-CM | POA: Diagnosis not present

## 2023-03-08 LAB — POCT GLYCOSYLATED HEMOGLOBIN (HGB A1C): Hemoglobin A1C: 5.7 % — AB (ref 4.0–5.6)

## 2023-03-08 MED ORDER — HYDROCHLOROTHIAZIDE 12.5 MG PO TABS: 12.5000 mg | ORAL_TABLET | Freq: Every day | ORAL | 3 refills | Status: DC

## 2023-03-08 MED ORDER — SYNTHROID 125 MCG PO TABS
125.0000 ug | ORAL_TABLET | Freq: Every day | ORAL | 3 refills | Status: DC
Start: 2023-03-08 — End: 2024-03-12

## 2023-03-08 NOTE — Progress Notes (Signed)
Complete physical exam  Patient: Carly Villa   DOB: Oct 12, 1965   57 y.o. Female  MRN: [id number]  Subjective:    Chief Complaint  Patient presents with   Annual Exam    Needs to do an A1C for work    Carly Villa is a 57 y.o. female who presents today for a complete physical exam. She reports consuming a general diet. Home exercise routine includes walking and at home weights for 30 minutes a day at least 3-4 times a week. She generally feels well. She reports sleeping well.  She continues on her thyroid medication and is having no difficulty with that.  She sees her gynecologist regularly.  Continues on HCTZ for her blood pressure.  She is a former smoker.  Does have osteopenia and is taking vitamin D and calcium.  Her allergies are under good control.  Work and home is stable.  She does not have additional problems to discuss today.    Most recent fall risk assessment:    03/08/2023    9:25 AM  Fall Risk   Falls in the past year? 0  Number falls in past yr: 0  Injury with Fall? 0     Most recent depression screenings:    03/08/2023    9:26 AM 02/09/2022    8:32 AM  PHQ 2/9 Scores  PHQ - 2 Score 0 0    Vision:Within last year and Dental: No current dental problems and Last dental visit: June 2024    Patient Care Team: Ronnald Nian, MD as PCP - General (Family Medicine)   Outpatient Medications Prior to Visit  Medication Sig   calcium carbonate (OS-CAL) 1250 MG chewable tablet Chew 1 tablet by mouth daily.   Cholecalciferol (VITAMIN D) 2000 UNITS CAPS Take by mouth.   estradiol (ESTRACE) 2 MG tablet Take 2 mg by mouth daily.   fish oil-omega-3 fatty acids 1000 MG capsule Take 2 g by mouth daily.   Multiple Vitamins-Minerals (MULTIVITAMIN WITH MINERALS) tablet Take 1 tablet by mouth daily.   progesterone (PROMETRIUM) 100 MG capsule Take 100 mg by mouth at bedtime.   [DISCONTINUED] hydrochlorothiazide (HYDRODIURIL) 12.5 MG tablet Take 1 tablet (12.5 mg  total) by mouth daily.   [DISCONTINUED] SYNTHROID 125 MCG tablet TAKE 1 TABLET BY MOUTH EVERY DAY   No facility-administered medications prior to visit.    Review of Systems  All other systems reviewed and are negative. Family and social history as well as health maintenance and immunizations was reviewed        Objective:     BP 124/82   Pulse (!) 58   Ht 5' 5.5" (1.664 m)   Wt 147 lb 9.6 oz (67 kg)   LMP 10/04/2010   SpO2 98%   BMI 24.19 kg/m    Physical Exam  Alert and in no distress. Tympanic membranes and canals are normal. Pharyngeal area is normal. Neck is supple without adenopathy or thyromegaly. Cardiac exam shows a regular sinus rhythm without murmurs or gallops. Lungs are clear to auscultation. Abdominal exam shows no masses or tenderness.     Assessment & Plan:    Routine general medical examination at a health care facility - Plan: CBC with Differential/Platelet, Comprehensive metabolic panel, Lipid panel  Allergic rhinitis, mild  Other specified hypothyroidism  Osteopenia, unspecified location  Essential hypertension - Plan: hydrochlorothiazide (HYDRODIURIL) 12.5 MG tablet  Hypothyroidism, unspecified type - Plan: TSH, SYNTHROID 125 MCG tablet  Screening for diabetes mellitus -  Plan: POCT glycosylated hemoglobin (Hb A1C)  Need for shingles vaccine - Plan: Varicella-zoster vaccine IM  Immunization History  Administered Date(s) Administered   DTaP 07/24/2005   Influenza,inj,Quad PF,6+ Mos 03/15/2017, 03/18/2018, 03/20/2019, 02/09/2022   Influenza-Unspecified 03/01/2016, 03/13/2020, 02/24/2021, 02/06/2023   PFIZER Comirnaty(Gray Top)Covid-19 Tri-Sucrose Vaccine 01/01/2021   PFIZER(Purple Top)SARS-COV-2 Vaccination 08/04/2019, 08/25/2019, 03/22/2020   Tdap 03/02/2016    Health Maintenance  Topic Date Due   HIV Screening  Never done   Zoster Vaccines- Shingrix (1 of 2) Never done   COVID-19 Vaccine (5 - 2023-24 season) 01/21/2023   Cervical  Cancer Screening (HPV/Pap Cotest)  06/24/2024   Fecal DNA (Cologuard)  07/25/2024   MAMMOGRAM  01/29/2025   DTaP/Tdap/Td (3 - Td or Tdap) 03/02/2026   INFLUENZA VACCINE  Completed   Hepatitis C Screening  Completed   HPV VACCINES  Aged Out    Discussed health benefits of physical activity, and encouraged her to engage in regular exercise appropriate for her age and condition.  Problem List Items Addressed This Visit     Allergic rhinitis, mild   Hypothyroidism   Relevant Medications   SYNTHROID 125 MCG tablet   Other Relevant Orders   TSH   Osteopenia   Other Visit Diagnoses     Routine general medical examination at a health care facility    -  Primary   Relevant Orders   CBC with Differential/Platelet   Comprehensive metabolic panel   Lipid panel   Essential hypertension       Relevant Medications   hydrochlorothiazide (HYDRODIURIL) 12.5 MG tablet   Screening for diabetes mellitus       Relevant Orders   POCT glycosylated hemoglobin (Hb A1C)   Need for shingles vaccine       Relevant Orders   Varicella-zoster vaccine IM      Encouraged her to continue to take good care of yourself and recheck here in 1 year.    Carly Gowda, MD

## 2023-03-09 LAB — COMPREHENSIVE METABOLIC PANEL
ALT: 19 [IU]/L (ref 0–32)
AST: 17 [IU]/L (ref 0–40)
Albumin: 4.5 g/dL (ref 3.8–4.9)
Alkaline Phosphatase: 62 [IU]/L (ref 44–121)
BUN/Creatinine Ratio: 17 (ref 9–23)
BUN: 10 mg/dL (ref 6–24)
Bilirubin Total: 0.2 mg/dL (ref 0.0–1.2)
CO2: 23 mmol/L (ref 20–29)
Calcium: 9.9 mg/dL (ref 8.7–10.2)
Chloride: 98 mmol/L (ref 96–106)
Creatinine, Ser: 0.6 mg/dL (ref 0.57–1.00)
Globulin, Total: 2.4 g/dL (ref 1.5–4.5)
Glucose: 96 mg/dL (ref 70–99)
Potassium: 4.6 mmol/L (ref 3.5–5.2)
Sodium: 136 mmol/L (ref 134–144)
Total Protein: 6.9 g/dL (ref 6.0–8.5)
eGFR: 106 mL/min/{1.73_m2} (ref >59)

## 2023-03-09 LAB — CBC WITH DIFFERENTIAL/PLATELET
Basophils Absolute: 0.1 10*3/uL (ref 0.0–0.2)
Basos: 1 %
EOS (ABSOLUTE): 0.2 10*3/uL (ref 0.0–0.4)
Eos: 2 %
Hematocrit: 37.4 % (ref 34.0–46.6)
Hemoglobin: 12.4 g/dL (ref 11.1–15.9)
Immature Grans (Abs): 0 10*3/uL (ref 0.0–0.1)
Immature Granulocytes: 0 %
Lymphocytes Absolute: 2.7 10*3/uL (ref 0.7–3.1)
Lymphs: 27 %
MCH: 31.3 pg (ref 26.6–33.0)
MCHC: 33.2 g/dL (ref 31.5–35.7)
MCV: 94 fL (ref 79–97)
Monocytes Absolute: 0.8 10*3/uL (ref 0.1–0.9)
Monocytes: 8 %
Neutrophils Absolute: 6.2 10*3/uL (ref 1.4–7.0)
Neutrophils: 62 %
Platelets: 393 10*3/uL (ref 150–450)
RBC: 3.96 x10E6/uL (ref 3.77–5.28)
RDW: 12.3 % (ref 11.7–15.4)
WBC: 9.9 10*3/uL (ref 3.4–10.8)

## 2023-03-09 LAB — LIPID PANEL
Chol/HDL Ratio: 2.8 {ratio} (ref 0.0–4.4)
Cholesterol, Total: 221 mg/dL — ABNORMAL HIGH (ref 100–199)
HDL: 79 mg/dL (ref >39)
LDL Chol Calc (NIH): 118 mg/dL — ABNORMAL HIGH (ref 0–99)
Triglycerides: 138 mg/dL (ref 0–149)
VLDL Cholesterol Cal: 24 mg/dL (ref 5–40)

## 2023-03-09 LAB — TSH: TSH: 0.986 u[IU]/mL (ref 0.450–4.500)

## 2023-05-05 ENCOUNTER — Other Ambulatory Visit: Payer: Self-pay | Admitting: Family Medicine

## 2023-05-05 DIAGNOSIS — E039 Hypothyroidism, unspecified: Secondary | ICD-10-CM

## 2023-05-10 ENCOUNTER — Other Ambulatory Visit (INDEPENDENT_AMBULATORY_CARE_PROVIDER_SITE_OTHER): Payer: BC Managed Care – PPO

## 2023-05-10 DIAGNOSIS — Z23 Encounter for immunization: Secondary | ICD-10-CM

## 2023-05-31 DIAGNOSIS — H5213 Myopia, bilateral: Secondary | ICD-10-CM | POA: Diagnosis not present

## 2023-05-31 DIAGNOSIS — H16403 Unspecified corneal neovascularization, bilateral: Secondary | ICD-10-CM | POA: Diagnosis not present

## 2023-07-17 ENCOUNTER — Encounter: Payer: Self-pay | Admitting: Internal Medicine

## 2024-01-08 DIAGNOSIS — L814 Other melanin hyperpigmentation: Secondary | ICD-10-CM | POA: Diagnosis not present

## 2024-01-08 DIAGNOSIS — D225 Melanocytic nevi of trunk: Secondary | ICD-10-CM | POA: Diagnosis not present

## 2024-01-08 DIAGNOSIS — L578 Other skin changes due to chronic exposure to nonionizing radiation: Secondary | ICD-10-CM | POA: Diagnosis not present

## 2024-01-08 DIAGNOSIS — L821 Other seborrheic keratosis: Secondary | ICD-10-CM | POA: Diagnosis not present

## 2024-01-31 DIAGNOSIS — Z13820 Encounter for screening for osteoporosis: Secondary | ICD-10-CM | POA: Diagnosis not present

## 2024-01-31 DIAGNOSIS — Z01419 Encounter for gynecological examination (general) (routine) without abnormal findings: Secondary | ICD-10-CM | POA: Diagnosis not present

## 2024-01-31 DIAGNOSIS — Z6824 Body mass index (BMI) 24.0-24.9, adult: Secondary | ICD-10-CM | POA: Diagnosis not present

## 2024-01-31 DIAGNOSIS — Z1231 Encounter for screening mammogram for malignant neoplasm of breast: Secondary | ICD-10-CM | POA: Diagnosis not present

## 2024-01-31 LAB — HM DEXA SCAN

## 2024-02-05 ENCOUNTER — Other Ambulatory Visit: Payer: Self-pay | Admitting: Obstetrics and Gynecology

## 2024-02-05 DIAGNOSIS — R928 Other abnormal and inconclusive findings on diagnostic imaging of breast: Secondary | ICD-10-CM

## 2024-02-12 ENCOUNTER — Ambulatory Visit
Admission: RE | Admit: 2024-02-12 | Discharge: 2024-02-12 | Disposition: A | Discharge status: Home / Self Care | Source: Ambulatory Visit | Attending: Obstetrics and Gynecology | Admitting: Obstetrics and Gynecology

## 2024-02-12 DIAGNOSIS — R928 Other abnormal and inconclusive findings on diagnostic imaging of breast: Secondary | ICD-10-CM

## 2024-02-12 DIAGNOSIS — N6002 Solitary cyst of left breast: Secondary | ICD-10-CM | POA: Diagnosis not present

## 2024-02-21 DIAGNOSIS — N939 Abnormal uterine and vaginal bleeding, unspecified: Secondary | ICD-10-CM | POA: Diagnosis not present

## 2024-02-21 DIAGNOSIS — N950 Postmenopausal bleeding: Secondary | ICD-10-CM | POA: Diagnosis not present

## 2024-03-12 ENCOUNTER — Encounter: Payer: Self-pay | Admitting: Family Medicine

## 2024-03-12 ENCOUNTER — Ambulatory Visit: Payer: BC Managed Care – PPO | Admitting: Family Medicine

## 2024-03-12 VITALS — BP 130/70 | HR 65 | Ht 65.0 in | Wt 145.4 lb

## 2024-03-12 DIAGNOSIS — E039 Hypothyroidism, unspecified: Secondary | ICD-10-CM | POA: Diagnosis not present

## 2024-03-12 DIAGNOSIS — R7303 Prediabetes: Secondary | ICD-10-CM | POA: Diagnosis not present

## 2024-03-12 DIAGNOSIS — Z136 Encounter for screening for cardiovascular disorders: Secondary | ICD-10-CM

## 2024-03-12 DIAGNOSIS — I10 Essential (primary) hypertension: Secondary | ICD-10-CM | POA: Diagnosis not present

## 2024-03-12 DIAGNOSIS — Z0000 Encounter for general adult medical examination without abnormal findings: Secondary | ICD-10-CM | POA: Diagnosis not present

## 2024-03-12 LAB — COMPREHENSIVE METABOLIC PANEL WITH GFR
ALT: 15 IU/L (ref 0–32)
AST: 20 IU/L (ref 0–40)
Albumin: 4.3 g/dL (ref 3.8–4.9)
Alkaline Phosphatase: 62 IU/L (ref 49–135)
BUN/Creatinine Ratio: 17 (ref 9–23)
BUN: 11 mg/dL (ref 6–24)
Bilirubin Total: 0.3 mg/dL (ref 0.0–1.2)
CO2: 20 mmol/L (ref 20–29)
Calcium: 9.5 mg/dL (ref 8.7–10.2)
Chloride: 97 mmol/L (ref 96–106)
Creatinine, Ser: 0.63 mg/dL (ref 0.57–1.00)
Globulin, Total: 2 g/dL (ref 1.5–4.5)
Glucose: 87 mg/dL (ref 70–99)
Potassium: 4.5 mmol/L (ref 3.5–5.2)
Sodium: 135 mmol/L (ref 134–144)
Total Protein: 6.3 g/dL (ref 6.0–8.5)
eGFR: 104 mL/min/1.73 (ref >59)

## 2024-03-12 LAB — CBC WITH DIFFERENTIAL/PLATELET
Basophils Absolute: 0.1 x10E3/uL (ref 0.0–0.2)
Basos: 1 %
EOS (ABSOLUTE): 0.3 x10E3/uL (ref 0.0–0.4)
Eos: 3 %
Hematocrit: 35.6 % (ref 34.0–46.6)
Hemoglobin: 11.5 g/dL (ref 11.1–15.9)
Immature Grans (Abs): 0 x10E3/uL (ref 0.0–0.1)
Immature Granulocytes: 0 %
Lymphocytes Absolute: 2.5 x10E3/uL (ref 0.7–3.1)
Lymphs: 25 %
MCH: 30.9 pg (ref 26.6–33.0)
MCHC: 32.3 g/dL (ref 31.5–35.7)
MCV: 96 fL (ref 79–97)
Monocytes Absolute: 0.9 x10E3/uL (ref 0.1–0.9)
Monocytes: 9 %
Neutrophils Absolute: 6.4 x10E3/uL (ref 1.4–7.0)
Neutrophils: 62 %
Platelets: 442 x10E3/uL (ref 150–450)
RBC: 3.72 x10E6/uL — ABNORMAL LOW (ref 3.77–5.28)
RDW: 12.8 % (ref 11.7–15.4)
WBC: 10.2 x10E3/uL (ref 3.4–10.8)

## 2024-03-12 LAB — LIPID PANEL
Chol/HDL Ratio: 3.3 ratio (ref 0.0–4.4)
Cholesterol, Total: 223 mg/dL — ABNORMAL HIGH (ref 100–199)
HDL: 67 mg/dL (ref >39)
LDL Chol Calc (NIH): 123 mg/dL — ABNORMAL HIGH (ref 0–99)
Triglycerides: 192 mg/dL — ABNORMAL HIGH (ref 0–149)
VLDL Cholesterol Cal: 33 mg/dL (ref 5–40)

## 2024-03-12 LAB — HEMOGLOBIN A1C
Est. average glucose Bld gHb Est-mCnc: 114 mg/dL
Hgb A1c MFr Bld: 5.6 % (ref 4.8–5.6)

## 2024-03-12 LAB — TSH: TSH: 2.12 u[IU]/mL (ref 0.450–4.500)

## 2024-03-12 MED ORDER — SYNTHROID 125 MCG PO TABS
125.0000 ug | ORAL_TABLET | Freq: Every day | ORAL | 3 refills | Status: AC
Start: 2024-03-12 — End: ?

## 2024-03-12 NOTE — Progress Notes (Signed)
 Name: Carly Villa   Date of Visit: 03/12/24   Date of last visit with me: Visit date not found   CHIEF COMPLAINT:  Chief Complaint  Patient presents with   Annual Exam    Cpe.        HPI:  Discussed the use of AI scribe software for clinical note transcription with the patient, who gave verbal consent to proceed.  History of Present Illness   Carly Villa is a 58 year old female with hypertension who presents for medication management and follow-up.  She is currently on a combination of lisinopril and hydrochlorothiazide  for hypertension. Initially, she was on hydrochlorothiazide  alone, but it was not effective, leading to the addition of lisinopril. She maintains a consistent exercise routine and is working on weight loss to aid in blood pressure management.  She has a history of prediabetes, with an A1c of 5.6 last year, necessitating more frequent health assessments. Her A1c was tested again today, and she is awaiting the results.  She has osteopenia, and a recent DEXA scan showed slight improvement.  She is on Synthroid  for hypothyroidism and requires a refill.  She works for [employer], which is self-insured and requires adherence to a wellness plan, including regular visits to a work Engineer, civil (consulting). She is proactive about her health and complies with the requirements to maintain her health insurance benefits.         OBJECTIVE:       03/12/2024    1:58 PM  Depression screen PHQ 2/9  Decreased Interest 0  Down, Depressed, Hopeless 0  PHQ - 2 Score 0     BP Readings from Last 3 Encounters:  03/12/24 130/70  03/08/23 124/82  04/10/22 (!) 140/82    BP 130/70   Pulse 65   Ht 5' 5 (1.651 m)   Wt 145 lb 6.4 oz (66 kg)   LMP 10/04/2010   SpO2 98%   BMI 24.20 kg/m    Physical Exam   VITALS: BP- 130/70      Physical Exam Constitutional:      Appearance: Normal appearance.  Neurological:     General: No focal deficit present.      Mental Status: She is alert and oriented to person, place, and time. Mental status is at baseline.     ASSESSMENT/PLAN:   Assessment & Plan Annual physical exam  Primary hypertension  Encounter for screening for cardiovascular disorders  Prediabetes  Hypothyroidism, unspecified type    Assessment and Plan    Hypertension Blood pressure at 130/70. Current regimen includes lisinopril and hydrochlorothiazide . Discussed potential stress on kidneys and future medication adjustments due to age-related changes. - Monitor blood pressure at home. - Check potassium levels with lab results. - Continue current medication regimen.  Prediabetes Previously identified with A1c of 5.7. Discussed weight loss to reduce A1c levels. - Review A1c results from today's blood work. - Encourage weight loss through increased physical activity.  Osteopenia Recent DEXA scan showed slight improvement. Discussed importance of weight training for bone strength. - Encourage resumption of weight training exercises. - Obtain DEXA scan results from [facility].  Hypothyroidism Currently on Synthroid . - Refill Synthroid  prescription as is.  Adult Wellness Visit Overall health good. Blood pressure slightly elevated at 130/70. Discussed weight loss strategies. - Encourage increased daily steps and reduced food intake for weight loss. - Continue current exercise regimen.  -Comprehensive annual physical exam completed today. Reviewed interval history, current medical issues, medications, allergies,  and preventive care needs. Addressed all patient questions and concerns. Discussed lifestyle factors including diet, exercise, sleep, and stress management. Reviewed recommended age-appropriate screenings, labs, and vaccinations. Counseling provided on healthy habits and routine health maintenance. Follow-up as indicated based on findings and results.         Addyson Traub A. Vita MD [facility]

## 2024-03-14 ENCOUNTER — Ambulatory Visit: Payer: Self-pay | Admitting: Family Medicine

## 2024-03-14 DIAGNOSIS — E782 Mixed hyperlipidemia: Secondary | ICD-10-CM

## 2024-03-14 MED ORDER — ROSUVASTATIN CALCIUM 10 MG PO TABS
10.0000 mg | ORAL_TABLET | Freq: Every day | ORAL | 3 refills | Status: AC
Start: 2024-03-14 — End: ?

## 2024-03-14 NOTE — Telephone Encounter (Signed)
 Advised patient that we sent in medication.

## 2024-05-06 ENCOUNTER — Other Ambulatory Visit: Payer: Self-pay | Admitting: Family Medicine

## 2024-05-06 DIAGNOSIS — R1011 Right upper quadrant pain: Secondary | ICD-10-CM

## 2024-05-06 DIAGNOSIS — R1013 Epigastric pain: Secondary | ICD-10-CM

## 2024-05-08 ENCOUNTER — Ambulatory Visit: Admitting: Family Medicine

## 2024-05-08 ENCOUNTER — Encounter: Payer: Self-pay | Admitting: Family Medicine

## 2024-05-08 VITALS — BP 116/74 | HR 68 | Wt 145.6 lb

## 2024-05-08 DIAGNOSIS — K649 Unspecified hemorrhoids: Secondary | ICD-10-CM

## 2024-05-08 DIAGNOSIS — K5900 Constipation, unspecified: Secondary | ICD-10-CM | POA: Diagnosis not present

## 2024-05-08 MED ORDER — HYDROCORT-PRAMOXINE (PERIANAL) 1-1 % EX FOAM: 1.0000 | Freq: Two times a day (BID) | CUTANEOUS | 1 refills | Status: AC

## 2024-05-08 NOTE — Progress Notes (Signed)
° °  Name: Carly Villa   Date of Visit: 05/08/2024   Date of last visit with me: 03/12/2024   CHIEF COMPLAINT:  Chief Complaint  Patient presents with   Acute Visit    Hemorrhoid check. Currently has hemorrhoid, pt states pain and was advised to come to Office to check it out. Was given a RX for it last week, hydrocortisone , didn't bring it w/ them. Started the week of Thanksgiving, states pressure and it's getting worse        HPI:  Discussed the use of AI scribe software for clinical note transcription with the patient, who gave verbal consent to proceed.  History of Present Illness   Carly Villa is a 58 year old female who presents with hemorrhoid issues. They were referred by their work nurse for hemorrhoid issues.  They have been experiencing hemorrhoid symptoms since the week of Thanksgiving, which have progressively worsened. Symptoms include burning, itching, and bleeding, leading to reluctance in using the bathroom. They have been eating less to avoid bowel movements due to discomfort.  Pain and discomfort are particularly noted when sitting, with a history of straining during bowel movements. They have been attempting to manage symptoms by consuming fiber-rich foods like salads and beans, although this is challenging at times. Hydration is maintained by drinking six to eight large bottles of water daily, though they sometimes fall short due to a busy schedule.  They have been using a prescription hydrocortisone  cream provided by their work nurse, which has offered some relief. Additionally, they use Tucks and Preparation H. They mention that their husband had hemorrhoid surgery eight years ago.  During the review of symptoms, they report significant bright red bleeding and sometimes feeling a tear.         OBJECTIVE:       03/12/2024    1:58 PM  Depression screen PHQ 2/9  Decreased Interest 0  Down, Depressed, Hopeless 0  PHQ - 2 Score 0     BP  Readings from Last 3 Encounters:  05/08/24 116/74  03/12/24 130/70  03/08/23 124/82    BP 116/74   Pulse 68   Wt 145 lb 9.6 oz (66 kg)   LMP 10/04/2010   SpO2 98%   BMI 24.23 kg/m    Physical Exam   RECTAL: Hemorrhoids present, not thrombosed.      Physical Exam Constitutional:      Appearance: Normal appearance.  Genitourinary:    Comments: Noted external hemorrhoids with 1 possible hemorrhoid prolapse. No signs of thrombosis.  No signs of bleeding   Neurological:     General: No focal deficit present.     Mental Status: She is alert and oriented to person, place, and time. Mental status is at baseline.     ASSESSMENT/PLAN:   Assessment & Plan Constipation, unspecified constipation type  Hemorrhoids, unspecified hemorrhoid type    Assessment and Plan    Hemorrhoids Acute hemorrhoids with burning, itching, and bleeding. Non-thrombosed, non-prolapsed. Conservative management recommended. Surgery not indicated unless chronic and unresponsive. - Initiate daily Miralax. - Maintain high water intake. - Prescribe Proctofoam cream. - Use Tucks pads. - Encourage dietary fiber intake. - Advise regular exercise and walking. - Consider Docusate if needed. - Schedule follow-up in 2-3 weeks.         Carly Villa A. Vita MD [facility]

## 2024-05-26 ENCOUNTER — Encounter: Payer: Self-pay | Admitting: Family Medicine

## 2024-05-26 ENCOUNTER — Ambulatory Visit: Admitting: Family Medicine

## 2024-05-26 VITALS — BP 120/80 | HR 66 | Wt 145.0 lb

## 2024-05-26 DIAGNOSIS — K649 Unspecified hemorrhoids: Secondary | ICD-10-CM | POA: Diagnosis not present

## 2024-05-26 NOTE — Progress Notes (Signed)
" ° °  Name: Carly Villa   Date of Visit: 05/26/2024   Date of last visit with me: 05/08/2024   CHIEF COMPLAINT:  Chief Complaint  Patient presents with   Follow-up    Follow up, everything is healed and is better.        HPI:  Discussed the use of AI scribe software for clinical note transcription with the patient, who gave verbal consent to proceed.  History of Present Illness   Carly Villa is a 59 year old female who presents for a follow-up regarding dietary fiber intake and bowel habits.  She has been attempting to increase her dietary fiber intake by incorporating oatmeal in the mornings and trying to include more fiber in her diet. However, she finds it challenging to reach the recommended daily intake.  She uses Miralax as needed but is considering Metamucil as a daily supplement to help meet her fiber goals. She finds Miralax strong and prefers to use it only when necessary.  She describes a change in her bathroom habits, stating that she now goes to the bathroom, 'does her thing,' and gets out quickly, which has helped alleviate symptoms. Reducing straining has led to improvement and healing of her symptoms, which were previously very painful.         OBJECTIVE:       03/12/2024    1:58 PM  Depression screen PHQ 2/9  Decreased Interest 0  Down, Depressed, Hopeless 0  PHQ - 2 Score 0     BP Readings from Last 3 Encounters:  05/26/24 120/80  05/08/24 116/74  03/12/24 130/70    BP 120/80   Pulse 66   Wt 145 lb (65.8 kg)   LMP 10/04/2010   SpO2 98%   BMI 24.13 kg/m    Physical Exam          Physical Exam Constitutional:      Appearance: Normal appearance.  Neurological:     General: No focal deficit present.     Mental Status: She is alert and oriented to person, place, and time. Mental status is at baseline.     ASSESSMENT/PLAN:   Assessment & Plan Hemorrhoids, unspecified hemorrhoid type    Assessment and Plan     Hemorrhoids Symptoms improved with fiber and lifestyle changes. Discussed recurrence and treatment options, including banding. - Continue dietary fiber intake of 20-30 grams daily. - Consider Metamucil if dietary fiber is insufficient. - Avoid prolonged toilet sitting and reduce bowel movement stress. - Contact provider if symptoms recur.         [name] A. Vita MD [facility] "

## 2025-03-12 ENCOUNTER — Encounter: Payer: Self-pay | Admitting: Family Medicine
